# Patient Record
Sex: Female | Born: 1980 | Race: Black or African American | Hispanic: No | Marital: Single | Smoking: Never smoker
Health system: Southern US, Community
[De-identification: ages and names within clinical notes are randomized; demographics above are authoritative.]

## PROBLEM LIST (undated history)

## (undated) DIAGNOSIS — R61 Generalized hyperhidrosis: Secondary | ICD-10-CM

## (undated) DIAGNOSIS — R7689 Other specified abnormal immunological findings in serum: Secondary | ICD-10-CM

## (undated) DIAGNOSIS — R51 Headache: Secondary | ICD-10-CM

## (undated) DIAGNOSIS — E785 Hyperlipidemia, unspecified: Secondary | ICD-10-CM

## (undated) DIAGNOSIS — F32A Depression, unspecified: Secondary | ICD-10-CM

## (undated) DIAGNOSIS — F419 Anxiety disorder, unspecified: Secondary | ICD-10-CM

## (undated) DIAGNOSIS — R768 Other specified abnormal immunological findings in serum: Secondary | ICD-10-CM

## (undated) DIAGNOSIS — I10 Essential (primary) hypertension: Secondary | ICD-10-CM

## (undated) DIAGNOSIS — B019 Varicella without complication: Secondary | ICD-10-CM

## (undated) DIAGNOSIS — R519 Headache, unspecified: Secondary | ICD-10-CM

## (undated) HISTORY — DX: Headache, unspecified: R51.9

## (undated) HISTORY — DX: Generalized hyperhidrosis: R61

## (undated) HISTORY — DX: Headache: R51

## (undated) HISTORY — DX: Other specified abnormal immunological findings in serum: R76.8

## (undated) HISTORY — DX: Hyperlipidemia, unspecified: E78.5

## (undated) HISTORY — DX: Varicella without complication: B01.9

## (undated) HISTORY — DX: Other specified abnormal immunological findings in serum: R76.89

## (undated) HISTORY — PX: CYST EXCISION: SHX5701

## (undated) HISTORY — DX: Essential (primary) hypertension: I10

---

## 2001-11-22 ENCOUNTER — Other Ambulatory Visit: Admission: RE | Admit: 2001-11-22 | Discharge: 2001-11-22 | Payer: Self-pay | Admitting: Obstetrics and Gynecology

## 2002-04-10 ENCOUNTER — Inpatient Hospital Stay (HOSPITAL_COMMUNITY): Admission: AD | Admit: 2002-04-10 | Discharge: 2002-04-10 | Payer: Self-pay | Admitting: *Deleted

## 2002-04-10 ENCOUNTER — Encounter: Payer: Self-pay | Admitting: Obstetrics and Gynecology

## 2002-04-11 ENCOUNTER — Encounter (HOSPITAL_COMMUNITY): Admission: RE | Admit: 2002-04-11 | Discharge: 2002-04-29 | Payer: Self-pay | Admitting: Obstetrics and Gynecology

## 2002-04-22 ENCOUNTER — Inpatient Hospital Stay (HOSPITAL_COMMUNITY): Admission: AD | Admit: 2002-04-22 | Discharge: 2002-04-22 | Payer: Self-pay | Admitting: Obstetrics and Gynecology

## 2002-05-06 ENCOUNTER — Encounter (INDEPENDENT_AMBULATORY_CARE_PROVIDER_SITE_OTHER): Payer: Self-pay

## 2002-05-06 ENCOUNTER — Inpatient Hospital Stay (HOSPITAL_COMMUNITY): Admission: AD | Admit: 2002-05-06 | Discharge: 2002-05-09 | Payer: Self-pay | Admitting: Obstetrics and Gynecology

## 2002-05-10 ENCOUNTER — Observation Stay (HOSPITAL_COMMUNITY): Admission: AD | Admit: 2002-05-10 | Discharge: 2002-05-10 | Payer: Self-pay | Admitting: Obstetrics and Gynecology

## 2002-06-03 ENCOUNTER — Other Ambulatory Visit: Admission: RE | Admit: 2002-06-03 | Discharge: 2002-06-03 | Payer: Self-pay | Admitting: Obstetrics and Gynecology

## 2003-06-15 ENCOUNTER — Other Ambulatory Visit: Admission: RE | Admit: 2003-06-15 | Discharge: 2003-06-15 | Payer: Self-pay | Admitting: Obstetrics and Gynecology

## 2003-09-28 ENCOUNTER — Emergency Department (HOSPITAL_COMMUNITY): Admission: EM | Admit: 2003-09-28 | Discharge: 2003-09-28 | Payer: Self-pay | Admitting: Emergency Medicine

## 2003-11-30 ENCOUNTER — Encounter: Admission: RE | Admit: 2003-11-30 | Discharge: 2003-11-30 | Payer: Self-pay | Admitting: Family Medicine

## 2004-06-21 ENCOUNTER — Other Ambulatory Visit: Admission: RE | Admit: 2004-06-21 | Discharge: 2004-06-21 | Payer: Self-pay | Admitting: Obstetrics and Gynecology

## 2005-05-18 ENCOUNTER — Other Ambulatory Visit: Admission: RE | Admit: 2005-05-18 | Discharge: 2005-05-18 | Payer: Self-pay | Admitting: Obstetrics and Gynecology

## 2006-09-19 ENCOUNTER — Other Ambulatory Visit: Admission: RE | Admit: 2006-09-19 | Discharge: 2006-09-19 | Payer: Self-pay | Admitting: Obstetrics and Gynecology

## 2007-01-14 ENCOUNTER — Emergency Department (HOSPITAL_COMMUNITY): Admission: EM | Admit: 2007-01-14 | Discharge: 2007-01-15 | Payer: Self-pay | Admitting: Emergency Medicine

## 2007-01-16 ENCOUNTER — Ambulatory Visit: Payer: Self-pay | Admitting: Cardiovascular Disease

## 2007-02-05 ENCOUNTER — Ambulatory Visit: Payer: Self-pay

## 2007-02-05 ENCOUNTER — Encounter: Payer: Self-pay | Admitting: Cardiovascular Disease

## 2008-09-09 IMAGING — CR DG CHEST 2V
2 series · 2 of 2 positions shown · non-contrast
Comparison: none

HISTORY: Chest pain, vomiting

CHEST 2 VIEWS:
Comparison chest radiograph 09/28/2003
Normal heart size, mediastinal contours, and pulmonary vascularity.
Minimal bronchitic changes.
Lungs otherwise clear.
Mild scoliosis thoracolumbar spine.
No pleural effusion or pneumothorax.

[view not recorded (1 of 2)]
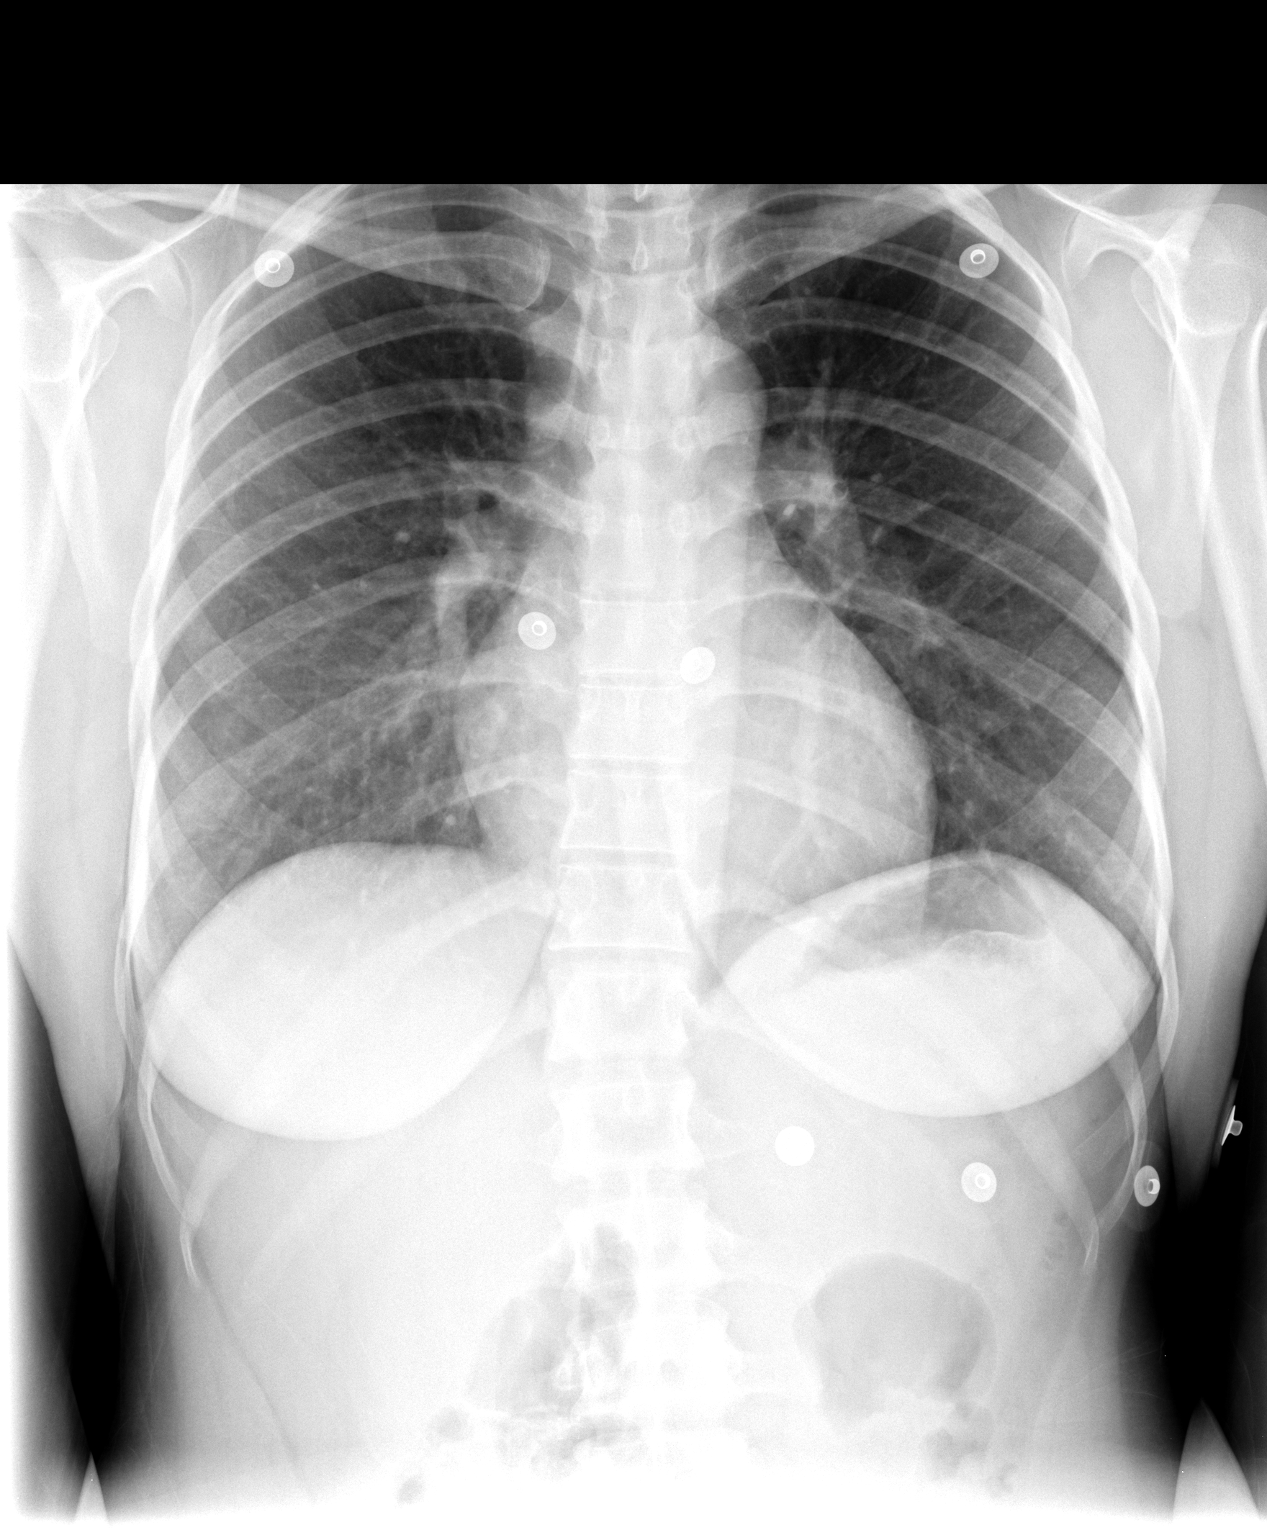

[view not recorded (2 of 2)]
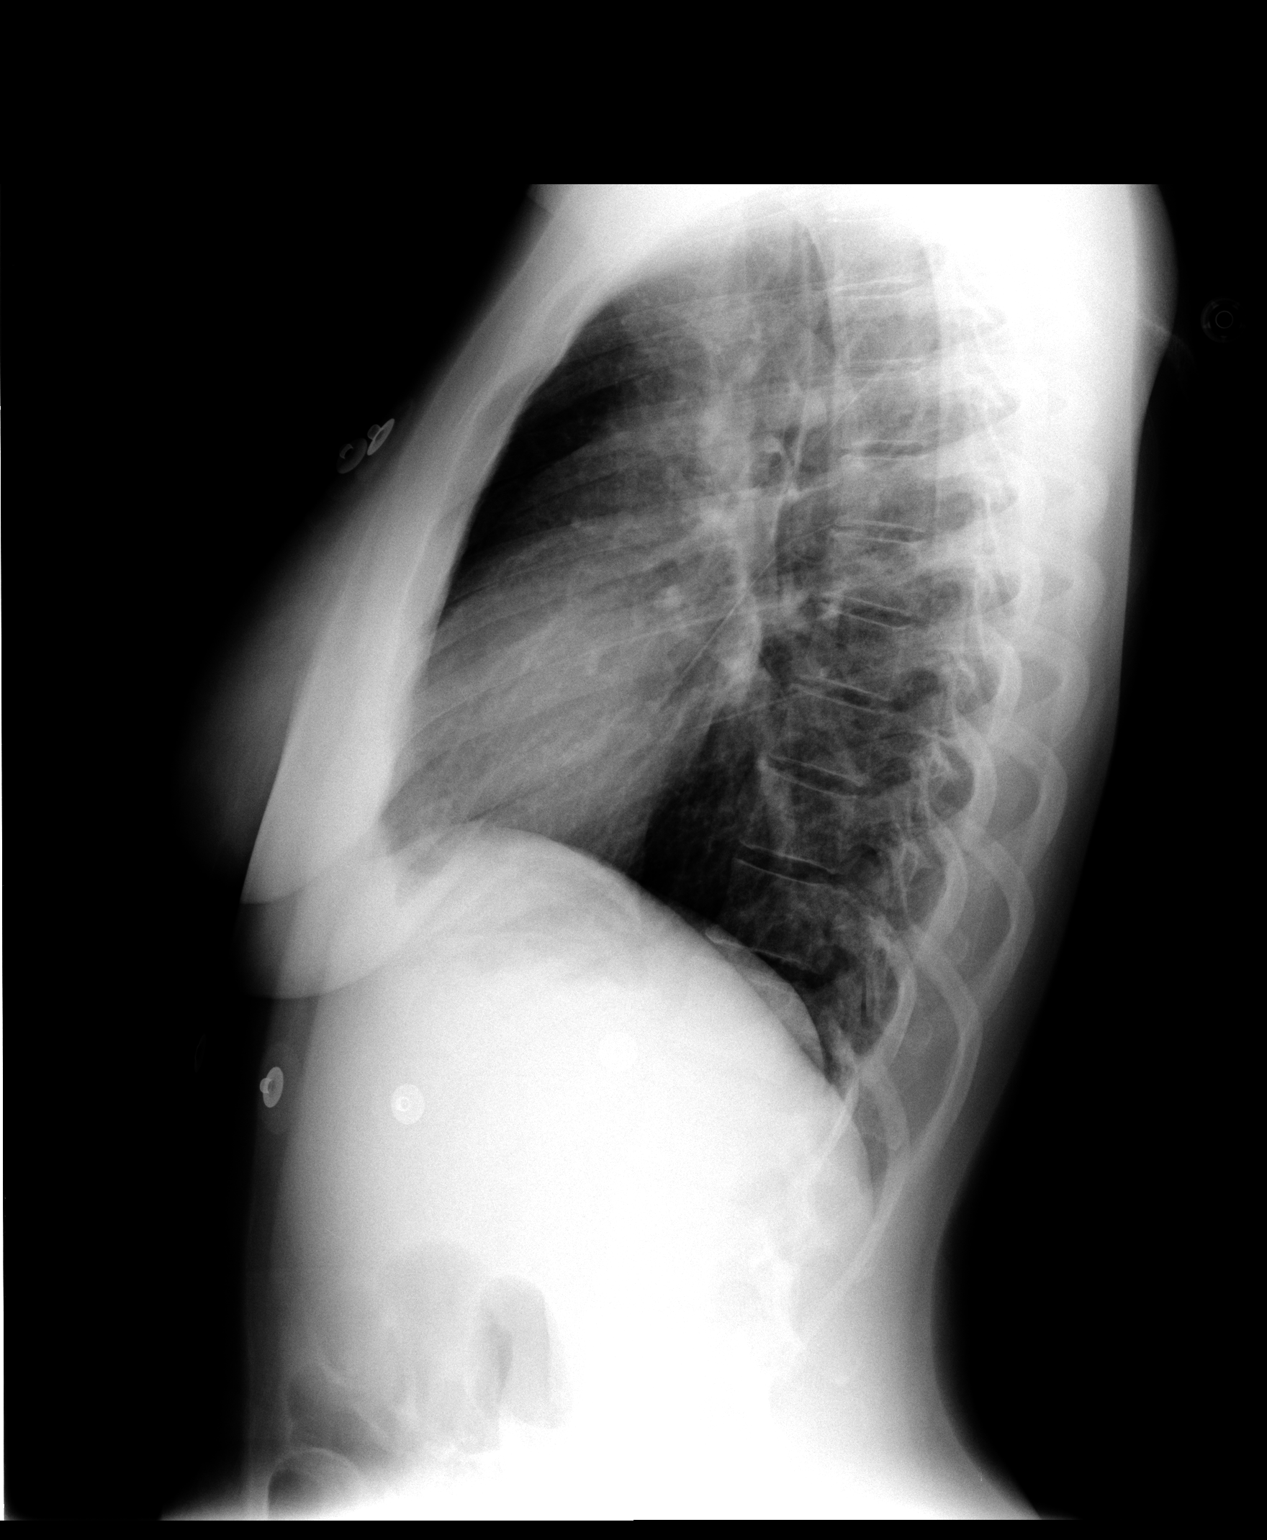

[2 of 2 positions shown; findings below may reference images not displayed]

IMPRESSION: Minimal bronchitic changes.
Mild scoliosis.

## 2010-11-29 NOTE — Assessment & Plan Note (Signed)
[facility]                            CARDIOLOGY OFFICE NOTE   NAME:Kristine [name], Kristine [name]                      MRN:          [id number]  DATE:01/16/2007                            DOB:          1980/09/10    Kristine [name] is seen today as a new consult.  She was recently seen in the  emergency room at [facility] on June 30 for chest pain.  The pain was  atypical.  She was driving her car.  She had some pain under her left  arm pit and upper shoulder, radiated towards her chest.  She had  subsequent nausea and an episode of vomiting.  There was no significant  diaphoresis, no syncope.  In the emergency room she ruled out for  myocardial infarction.  She had a baseline abnormal EKG that she knew  about from before.  Her lab work was fine.  She was treated with some  Zofran and antiinflammatories.  Chest x-rays showed minimal bronchitic  changes and scoliosis.  She was discharged and asked to follow up with a  cardiologist.  Since discharge she has been doing well.  She has not had  any recurrences.   Her coronary risk factors are benign.  She is not hypertensive,  nondiabetic, she is not a smoker, family history is negative.   The patient does work out at [employer] 2 to 3 times a week.  She does not  get lightheaded, there has been no syncope, palpitations or chest pain.   The patient's review of systems is otherwise negative.   She only takes birth control pills.   She has no known allergies.   FAMILY HISTORY:  Noncontributory.  She is divorced.  Her son [name] who  is 53 years old was with her.  She used to work here at [employer] 3-1/2  years ago and currently works at [employer].   She does not drink or smoke.   She has lots of family here in the area and lives out in [place].   She has seen Dr. Alwyn Ren in the past as a primary care doctor.   Her exam is remarkable for a healthy appearing young black female in no  distress.  Affect is  appropriate.  She is afebrile.  Blood pressure is 117/70.  Pulse is 86 and regular.  Respiratory rate is 12.  HEENT:  Normal.  NECK:  Supple.  There is no thyromegaly, no lymphadenopathy, no bruits  and no JVP elevation.  LUNGS:  Clear with good diaphragmatic motion.  No wheezing.  There is normal S1, S2 with normal heart sounds. PMI is normal.  ABDOMEN:  Benign.  Bowel sounds are positive. There is no  hepatosplenomegaly, no hepatojugular reflex, no organomegaly, no  tenderness, no bruits.  Femorals are +4 bilaterally without bruit.  PTs are +3.  There is no  edema.  NEURO:  Nonfocal.  SKIN:  Warm and dry. She does have a previous cyst removal on the left  hand with a keloid formation.  There is no muscular weakness.   EKG shows sinus rhythm with voltage  criteria for LVH in the limb leads  and nonspecific inferolateral ST-T wave changes.  I do not have an old  EKG to compare this to but she said that 1 of our techs,  had done one  on her 3 years ago.  We will try to locate it.   IMPRESSION:  1. Isolated episode of chest pain 3 days ago. No recurrence.  I      suspect that she had some musculoskeletal pain and then had a vagal      reaction to it.  She certainly seems fine now and her chest pain      has resolved.  2. Baseline abnormal EKG.  She needs a followup echocardiogram to rule      out any significant hypertrophic cardiomyopathy or LVH.  Apparently      there was one done here 3 years ago, but we have no record of it.  3. Given her abnormal EKG and chest pain, we will refer her for a      stress echo.  This will involve no radiation and should be      diagnostic of any problems.  She is actually a friend of Alona Bene      __________, our echo tech and we will try to have Alona Bene do the      study.  4. Birth control pills.  The patient is on birth control pills;      however, she is a nonsmoker and has not had previous deep vein      thrombosis.  I do not think there is any  contraindications to this      in regards to hypercoagulability.  5. As long as her stress echo is normal, I will see her on an as      needed basis.     Noralyn Pick. Eden Emms, MD, [facility]  Electronically Signed    PCN/MedQ  DD: 01/16/2007  DT: 01/17/2007  Job #: [id number]

## 2010-12-02 NOTE — Discharge Summary (Signed)
[name], [name]                           ACCOUNT NO.:  [id number]   MEDICAL RECORD NO.:  [id number]                   PATIENT TYPE:   LOCATION:                                       FACILITY:   PHYSICIAN:  James A. Ashley Royalty, M.D.             DATE OF BIRTH:   DATE OF ADMISSION:  DATE OF DISCHARGE:                                 DISCHARGE SUMMARY   DISCHARGE DIAGNOSES:  1. Intrauterine pregnancy at 40 weeks and 5 days gestation, delivered.  2. Term birth living child.  3. Arrest disorder of descent in labor, chorioamnionitis and fetal     tachycardia resulting in Cesarean section.   CONSULTATIONS:  None.   DISCHARGE MEDICATIONS:  Percocet.   HISTORY OF PRESENT ILLNESS:  This is a 30 year old [place]. [facility]  May 01, 2002 who presented to an office visit at which time she was  noted to be contracting every five to ten minutes. She was sent to [facility] for further evaluation.  For the remainder of the history and  physical, please see the chart.   HOSPITAL COURSE:  The patient was admitted to [facility]  [place]. Admission lab studies were drawn. Initial cervical examination  by me revealed a dilatation of 4 cm, 100% effacement, minus one station, and  vertex presentation. Artificial rupture of membranes was accomplished which  revealed clear fluid. Intrauterine pressure catheter was placed. The patient  subsequently developed a fever at 9+ cm gestation. At that point, she was  zero station. Further attempts were made to accomplish a vaginal delivery.  However, the patient was noted to have no descent in there hours despite  reasonable efforts to maximize the contraction quality and quantity. She  also developed fetal tachycardia. Hence, on May 06, 2002 for the above  reasons, she was taken to the OR and underwent primary low transverse C-  section. She was also noted to have meconium stain in the amniotic fluid.  The infant was an 8 pound  and 11 ounce female with Apgar's of 5 at one minute  and 9 at five minutes. Sent to the newborn nursery. Arterial cord pH was  7.26. The patient was maintained postpartum on IV antibiotics.   DISPOSITION:  On May 09, 2002 she was felt stable for discharged and  discharged home in satisfactory condition.   LABORATORY DATA:  Hemoglobin and hematocrit on admission were 12.6 and 36.4  respectively. Repeat value obtained on May 08, 2002 were 10.9 and 32.1  respectively. WBC count at the time of discharge was 18,200. RPR was non-  reactive.   FOLLOW UP:  The patient is to return to [facility] in four to six  weeks for postpartum evaluation.  James A. Ashley Royalty, M.D.    JAM/MEDQ  D:  06/15/2002  T:  06/15/2002  Job:  [id number]

## 2010-12-02 NOTE — Op Note (Signed)
NAME:  Kristine [name], Kristine [name]                         ACCOUNT NO.:  [id number]   MEDICAL RECORD NO.:  [id number]                   PATIENT TYPE:  INP   LOCATION:  9102                                 FACILITY:  [facility]   PHYSICIAN:  James A. Ashley Royalty, M.D.             DATE OF BIRTH:  [birth year]   DATE OF PROCEDURE:  05/06/2002  DATE OF DISCHARGE:                                 OPERATIVE REPORT   PREOPERATIVE DIAGNOSES:  1. Intrauterine pregnancy at term.  2. Arrest disorder of descent in labor.  3. Chorioamnionitis.  4. Fetal tachycardia.   POSTOPERATIVE DIAGNOSES:  1. Intrauterine pregnancy at term.  2. Meconium stained amniotic fluid.   OPERATION PERFORMED:  Primary low transverse cesarean section.   SURGEON:  Rudy Jew. Ashley Royalty, M.D.   ANESTHESIA:  Epidural.   FINDINGS:  An 8 pound, 11 ounce female, Apgars 5 at one minute, 9 at five  minutes, sent to newborn nursery.  Pediatrics team present at delivery.  Arterial cord pH 7.26.   ESTIMATED BLOOD LOSS:  600 cc.   COMPLICATIONS:  None.   PACKS AND DRAINS:  Foley catheter.   DESCRIPTION OF PROCEDURE:  The patient was taken to the operating room and  placed in dorsal supine position.  Epidural anesthetic was administered to a  surgical level.  She was prepped and draped in the usual manner for  abdominal surgery.  The Foley catheter had been previously placed.  A  Pfannenstiel incision was made down to the level of the fascia which was  nicked with a knife and incised transversely with Mayo scissors.  The  underlying rectus muscles were separated from the fascia using sharp and  blunt dissection.  The rectus muscles were separated in the midline exposing  the peritoneum which was then elevated and entered atraumatically with  Metzenbaum scissors.  The incision was extended longitudinally.  The uterus  was identified and a bladder flap created by incising the anterior uterine  serosa and sharply and bluntly dissected from the  bladder inferiorly.  It  was held in place with a bladder blade.  The uterine wall was noted to be  somewhat edematous.  The uterus was then entered through a low transverse  incision using sharp and blunt dissection.  Upon entry into the amniotic  cavity, the fluid was noted to be meconium stained for the first time.  The  infant was then delivered from the vertex presentation.  At delivery of the  head, DeLee suction was employed.  Full delivery was then accomplished and  the infant given immediately to the waiting pediatrics team after triply  clamping and cutting the cord.  Arterial cord pH was obtained from an  isolated segment.  Next, regular cord blood was obtained.  The placenta and  membranes were removed in their entirety and submitted to pathology for  histologic studies.  The uterus was exteriorized.  The uterus was  then  closed with two layers of running # 1 Vicryl, the first in a running locking  layer, the second was a running, intermittently locking, and imbricating  layer.  One additional figure-of-eight suture was required on the left side  to obtain hemostasis.  Hemostasis was noted.  The uterus, tubes and ovaries  were inspected and found to be otherwise normal.  They were returned to the  abdominal cavity.  Copious irrigation was accomplished.  Hemostasis was  noted.  The fascia was then closed with 0 Vicryl in a running fashion.  The  skin was closed with staples.  The patient tolerated the procedure extremely  well and was returned to the recovery room in good condition.                                                James A. Ashley Royalty, M.D.    JAM/MEDQ  D:  05/06/2002  T:  05/07/2002  Job:  [id number]

## 2011-05-03 LAB — DIFFERENTIAL
Basophils Relative: 1
Lymphocytes Relative: 27
Lymphs Abs: 2.5
Monocytes Relative: 9
Neutro Abs: 5.8
Neutrophils Relative %: 62

## 2011-05-03 LAB — CBC
RBC: 4.45
WBC: 9.3

## 2011-08-21 ENCOUNTER — Other Ambulatory Visit: Payer: Self-pay | Admitting: Obstetrics and Gynecology

## 2011-08-21 ENCOUNTER — Other Ambulatory Visit (HOSPITAL_COMMUNITY)
Admission: RE | Admit: 2011-08-21 | Discharge: 2011-08-21 | Disposition: A | Discharge status: Home / Self Care | Payer: PRIVATE HEALTH INSURANCE | Source: Ambulatory Visit | Attending: Obstetrics and Gynecology | Admitting: Obstetrics and Gynecology

## 2011-08-21 DIAGNOSIS — Z01419 Encounter for gynecological examination (general) (routine) without abnormal findings: Secondary | ICD-10-CM | POA: Insufficient documentation

## 2013-07-21 ENCOUNTER — Encounter: Payer: Self-pay | Admitting: Nurse Practitioner

## 2013-07-21 ENCOUNTER — Ambulatory Visit (INDEPENDENT_AMBULATORY_CARE_PROVIDER_SITE_OTHER): Payer: No Typology Code available for payment source | Admitting: Nurse Practitioner

## 2013-07-21 VITALS — BP 112/70 | HR 80 | Temp 98.2°F | Ht 63.16 in | Wt 147.5 lb

## 2013-07-21 DIAGNOSIS — Z0000 Encounter for general adult medical examination without abnormal findings: Secondary | ICD-10-CM

## 2013-07-21 DIAGNOSIS — R030 Elevated blood-pressure reading, without diagnosis of hypertension: Secondary | ICD-10-CM

## 2013-07-21 DIAGNOSIS — IMO0001 Reserved for inherently not codable concepts without codable children: Secondary | ICD-10-CM

## 2013-07-21 DIAGNOSIS — Z23 Encounter for immunization: Secondary | ICD-10-CM

## 2013-07-21 DIAGNOSIS — I10 Essential (primary) hypertension: Secondary | ICD-10-CM | POA: Insufficient documentation

## 2013-07-21 LAB — CBC
HCT: 37.6 % (ref 36.0–46.0)
Hemoglobin: 12.4 g/dL (ref 12.0–15.0)
MCHC: 33.1 g/dL (ref 30.0–36.0)
MCV: 80.2 fl (ref 78.0–100.0)
PLATELETS: 336 10*3/uL (ref 150.0–400.0)
RBC: 4.69 Mil/uL (ref 3.87–5.11)
RDW: 13.6 % (ref 11.5–14.6)
WBC: 6.2 10*3/uL (ref 4.5–10.5)

## 2013-07-21 LAB — COMPREHENSIVE METABOLIC PANEL
ALBUMIN: 4.6 g/dL (ref 3.5–5.2)
ALK PHOS: 64 U/L (ref 39–117)
ALT: 27 U/L (ref 0–35)
AST: 22 U/L (ref 0–37)
BUN: 12 mg/dL (ref 6–23)
CALCIUM: 9.6 mg/dL (ref 8.4–10.5)
CHLORIDE: 103 meq/L (ref 96–112)
CO2: 25 mEq/L (ref 19–32)
CREATININE: 0.7 mg/dL (ref 0.4–1.2)
GFR: 118.93 mL/min (ref >60.00)
Glucose, Bld: 86 mg/dL (ref 70–99)
POTASSIUM: 4.4 meq/L (ref 3.5–5.1)
Sodium: 135 mEq/L (ref 135–145)
Total Bilirubin: 0.6 mg/dL (ref 0.3–1.2)
Total Protein: 8.3 g/dL (ref 6.0–8.3)

## 2013-07-21 LAB — URINALYSIS, ROUTINE W REFLEX MICROSCOPIC
BILIRUBIN URINE: NEGATIVE
KETONES UR: NEGATIVE
Leukocytes, UA: NEGATIVE
Nitrite: NEGATIVE
SPECIFIC GRAVITY, URINE: 1.025 (ref 1.000–1.030)
Total Protein, Urine: NEGATIVE
UROBILINOGEN UA: 0.2 (ref 0.0–1.0)
Urine Glucose: NEGATIVE
pH: 6 (ref 5.0–8.0)

## 2013-07-21 LAB — LIPID PANEL
CHOL/HDL RATIO: 5
Cholesterol: 256 mg/dL — ABNORMAL HIGH (ref 0–200)
HDL: 55.9 mg/dL (ref >39.00)
TRIGLYCERIDES: 73 mg/dL (ref 0.0–149.0)
VLDL: 14.6 mg/dL (ref 0.0–40.0)

## 2013-07-21 LAB — T4, FREE: Free T4: 0.72 ng/dL (ref 0.60–1.60)

## 2013-07-21 LAB — TSH: TSH: 0.78 u[IU]/mL (ref 0.35–5.50)

## 2013-07-21 MED ORDER — TETANUS-DIPHTH-ACELL PERTUSSIS 5-2.5-18.5 LF-MCG/0.5 IM SUSP: 0.5000 mL | Freq: Once | INTRAMUSCULAR | Status: DC

## 2013-07-21 NOTE — Patient Instructions (Addendum)
Our office will call with results. Take BP at work several times on different days, call our office w/readings.  Use tylenol for pain rather than ibuprophen or aleve, as they can make BP go up. Practice deep breathing. Pay attention to body cues that stress is increasing.  Use daily sinus rinse for nasal congestion (Neilmed sinus rinse). Use benzocaine throat lozenge for sore throat. Increase fiber by eating something fresh every day-pears are high in fiber.   Preventive Care for Adults, Female A healthy lifestyle and preventive care can promote health and wellness. Preventive health guidelines for women include the following key practices.  A routine yearly physical is a good way to check with your caregiver about your health and preventive screening. It is a chance to share any concerns and updates on your health, and to receive a thorough exam.  Visit your dentist for a routine exam and preventive care every 6 months. Brush your teeth twice a day and floss once a day. Good oral hygiene prevents tooth decay and gum disease.  The frequency of eye exams is based on your age, health, family medical history, use of contact lenses, and other factors. Follow your caregiver's recommendations for frequency of eye exams.  Eat a healthy diet. Foods like vegetables, fruits, whole grains, low-fat dairy products, and lean protein foods contain the nutrients you need without too many calories. Decrease your intake of foods high in solid fats, added sugars, and salt. Eat the right amount of calories for you.Get information about a proper diet from your caregiver, if necessary.  Regular physical exercise is one of the most important things you can do for your health. Most adults should get at least 150 minutes of moderate-intensity exercise (any activity that increases your heart rate and causes you to sweat) each week. In addition, most adults need muscle-strengthening exercises on 2 or more days a  week.  Maintain a healthy weight. The body mass index (BMI) is a screening tool to identify possible weight problems. It provides an estimate of body fat based on height and weight. Your caregiver can help determine your BMI, and can help you achieve or maintain a healthy weight.For adults 20 years and older:  A BMI below 18.5 is considered underweight.  A BMI of 18.5 to 24.9 is normal.  A BMI of 25 to 29.9 is considered overweight.  A BMI of 30 and above is considered obese.  Maintain normal blood lipids and cholesterol levels by exercising and minimizing your intake of saturated fat. Eat a balanced diet with plenty of fruit and vegetables. Blood tests for lipids and cholesterol should begin at age 72 and be repeated every 5 years. If your lipid or cholesterol levels are high, you are over 50, or you are at high risk for heart disease, you may need your cholesterol levels checked more frequently.Ongoing high lipid and cholesterol levels should be treated with medicines if diet and exercise are not effective.  If you smoke, find out from your caregiver how to quit. If you do not use tobacco, do not start.  Lung cancer screening is recommended for adults aged 48 80 years who are at high risk for developing lung cancer because of a history of smoking. Yearly low-dose computed tomography (CT) is recommended for people who have at least a 30-pack-year history of smoking and are a current smoker or have quit within the past 15 years. A pack year of smoking is smoking an average of 1 pack of cigarettes a  day for 1 year (for example: 1 pack a day for 30 years or 2 packs a day for 15 years). Yearly screening should continue until the smoker has stopped smoking for at least 15 years. Yearly screening should also be stopped for people who develop a health problem that would prevent them from having lung cancer treatment.  If you are pregnant, do not drink alcohol. If you are breastfeeding, be very cautious  about drinking alcohol. If you are not pregnant and choose to drink alcohol, do not exceed 1 drink per day. One drink is considered to be 12 ounces (355 mL) of beer, 5 ounces (148 mL) of wine, or 1.5 ounces (44 mL) of liquor.  Avoid use of street drugs. Do not share needles with anyone. Ask for help if you need support or instructions about stopping the use of drugs.  High blood pressure causes heart disease and increases the risk of stroke. Your blood pressure should be checked at least every 1 to 2 years. Ongoing high blood pressure should be treated with medicines if weight loss and exercise are not effective.  If you are 70 to 32 years old, ask your caregiver if you should take aspirin to prevent strokes.  Diabetes screening involves taking a blood sample to check your fasting blood sugar level. This should be done once every 3 years, after age 58, if you are within normal weight and without risk factors for diabetes. Testing should be considered at a younger age or be carried out more frequently if you are overweight and have at least 1 risk factor for diabetes.  Breast cancer screening is essential preventive care for women. You should practice "breast self-awareness." This means understanding the normal appearance and feel of your breasts and may include breast self-examination. Any changes detected, no matter how small, should be reported to a caregiver. Women in their 71s and 30s should have a clinical breast exam (CBE) by a caregiver as part of a regular health exam every 1 to 3 years. After age 29, women should have a CBE every year. Starting at age 49, women should consider having a mammography (breast X-ray test) every year. Women who have a family history of breast cancer should talk to their caregiver about genetic screening. Women at a high risk of breast cancer should talk to their caregivers about having magnetic resonance imaging (MRI) and a mammography every year.  Breast cancer gene  (BRCA)-related cancer risk assessment is recommended for women who have family members with BRCA-related cancers. BRCA-related cancers include breast, ovarian, tubal, and peritoneal cancers. Having family members with these cancers may be associated with an increased risk for harmful changes (mutations) in the breast cancer genes BRCA1 and BRCA2. Results of the assessment will determine the need for genetic counseling and BRCA1 and BRCA2 testing.  The Pap test is a screening test for cervical cancer. A Pap test can show cell changes on the cervix that might become cervical cancer if left untreated. A Pap test is a procedure in which cells are obtained and examined from the lower end of the uterus (cervix).  Women should have a Pap test starting at age 54.  Between ages 81 and 4, Pap tests should be repeated every 2 years.  Beginning at age 33, you should have a Pap test every 3 years as long as the past 3 Pap tests have been normal.  Some women have medical problems that increase the chance of getting cervical cancer. Talk to your  caregiver about these problems. It is especially important to talk to your caregiver if a new problem develops soon after your last Pap test. In these cases, your caregiver may recommend more frequent screening and Pap tests.  The above recommendations are the same for women who have or have not gotten the vaccine for human papillomavirus (HPV).  If you had a hysterectomy for a problem that was not cancer or a condition that could lead to cancer, then you no longer need Pap tests. Even if you no longer need a Pap test, a regular exam is a good idea to make sure no other problems are starting.  If you are between ages 108 and 35, and you have had normal Pap tests going back 10 years, you no longer need Pap tests. Even if you no longer need a Pap test, a regular exam is a good idea to make sure no other problems are starting.  If you have had past treatment for cervical  cancer or a condition that could lead to cancer, you need Pap tests and screening for cancer for at least 20 years after your treatment.  If Pap tests have been discontinued, risk factors (such as a new sexual partner) need to be reassessed to determine if screening should be resumed.  The HPV test is an additional test that may be used for cervical cancer screening. The HPV test looks for the virus that can cause the cell changes on the cervix. The cells collected during the Pap test can be tested for HPV. The HPV test could be used to screen women aged 4 years and older, and should be used in women of any age who have unclear Pap test results. After the age of 67, women should have HPV testing at the same frequency as a Pap test.  Colorectal cancer can be detected and often prevented. Most routine colorectal cancer screening begins at the age of 47 and continues through age 65. However, your caregiver may recommend screening at an earlier age if you have risk factors for colon cancer. On a yearly basis, your caregiver may provide home test kits to check for hidden blood in the stool. Use of a small camera at the end of a tube, to directly examine the colon (sigmoidoscopy or colonoscopy), can detect the earliest forms of colorectal cancer. Talk to your caregiver about this at age 68, when routine screening begins. Direct examination of the colon should be repeated every 5 to 10 years through age 32, unless early forms of pre-cancerous polyps or small growths are found.  Hepatitis C blood testing is recommended for all people born from [birth year] through 1964 and any individual with known risks for hepatitis C.  Practice safe sex. Use condoms and avoid high-risk sexual practices to reduce the spread of sexually transmitted infections (STIs). STIs include gonorrhea, chlamydia, syphilis, trichomonas, herpes, HPV, and human immunodeficiency virus (HIV). Herpes, HIV, and HPV are viral illnesses that have no cure.  They can result in disability, cancer, and death. Sexually active women aged 48 and younger should be checked for chlamydia. Older women with new or multiple partners should also be tested for chlamydia. Testing for other STIs is recommended if you are sexually active and at increased risk.  Osteoporosis is a disease in which the bones lose minerals and strength with aging. This can result in serious bone fractures. The risk of osteoporosis can be identified using a bone density scan. Women ages 75 and over and women at  risk for fractures or osteoporosis should discuss screening with their caregivers. Ask your caregiver whether you should take a calcium supplement or vitamin D to reduce the rate of osteoporosis.  Menopause can be associated with physical symptoms and risks. Hormone replacement therapy is available to decrease symptoms and risks. You should talk to your caregiver about whether hormone replacement therapy is right for you.  Use sunscreen. Apply sunscreen liberally and repeatedly throughout the day. You should seek shade when your shadow is shorter than you. Protect yourself by wearing long sleeves, pants, a wide-brimmed hat, and sunglasses year round, whenever you are outdoors.  Once a month, do a whole body skin exam, using a mirror to look at the skin on your back. Notify your caregiver of new moles, moles that have irregular borders, moles that are larger than a pencil eraser, or moles that have changed in shape or color.  Stay current with required immunizations.  Influenza vaccine. All adults should be immunized every year.  Tetanus, diphtheria, and acellular pertussis (Td, Tdap) vaccine. Pregnant women should receive 1 dose of Tdap vaccine during each pregnancy. The dose should be obtained regardless of the length of time since the last dose. Immunization is preferred during the 27th to 36th week of gestation. An adult who has not previously received Tdap or who does not know her  vaccine status should receive 1 dose of Tdap. This initial dose should be followed by tetanus and diphtheria toxoids (Td) booster doses every 10 years. Adults with an unknown or incomplete history of completing a 3-dose immunization series with Td-containing vaccines should begin or complete a primary immunization series including a Tdap dose. Adults should receive a Td booster every 10 years.  Varicella vaccine. An adult without evidence of immunity to varicella should receive 2 doses or a second dose if she has previously received 1 dose. Pregnant females who do not have evidence of immunity should receive the first dose after pregnancy. This first dose should be obtained before leaving the health care facility. The second dose should be obtained 4 8 weeks after the first dose.  Human papillomavirus (HPV) vaccine. Females aged 42 26 years who have not received the vaccine previously should obtain the 3-dose series. The vaccine is not recommended for use in pregnant females. However, pregnancy testing is not needed before receiving a dose. If a female is found to be pregnant after receiving a dose, no treatment is needed. In that case, the remaining doses should be delayed until after the pregnancy. Immunization is recommended for any person with an immunocompromised condition through the age of 34 years if she did not get any or all doses earlier. During the 3-dose series, the second dose should be obtained 4 8 weeks after the first dose. The third dose should be obtained 24 weeks after the first dose and 16 weeks after the second dose.  Zoster vaccine. One dose is recommended for adults aged 35 years or older unless certain conditions are present.  Measles, mumps, and rubella (MMR) vaccine. Adults born before [birth year] generally are considered immune to measles and mumps. Adults born in [birth year] or later should have 1 or more doses of MMR vaccine unless there is a contraindication to the vaccine or there is  laboratory evidence of immunity to each of the three diseases. A routine second dose of MMR vaccine should be obtained at least 28 days after the first dose for students attending postsecondary schools, health care workers, or international travelers. People who  received inactivated measles vaccine or an unknown type of measles vaccine during 1963 1967 should receive 2 doses of MMR vaccine. People who received inactivated mumps vaccine or an unknown type of mumps vaccine before 1979 and are at high risk for mumps infection should consider immunization with 2 doses of MMR vaccine. For females of childbearing age, rubella immunity should be determined. If there is no evidence of immunity, females who are not pregnant should be vaccinated. If there is no evidence of immunity, females who are pregnant should delay immunization until after pregnancy. Unvaccinated health care workers born before [birth year] who lack laboratory evidence of measles, mumps, or rubella immunity or laboratory confirmation of disease should consider measles and mumps immunization with 2 doses of MMR vaccine or rubella immunization with 1 dose of MMR vaccine.  Pneumococcal 13-valent conjugate (PCV13) vaccine. When indicated, a person who is uncertain of her immunization history and has no record of immunization should receive the PCV13 vaccine. An adult aged 26 years or older who has certain medical conditions and has not been previously immunized should receive 1 dose of PCV13 vaccine. This PCV13 should be followed with a dose of pneumococcal polysaccharide (PPSV23) vaccine. The PPSV23 vaccine dose should be obtained at least 8 weeks after the dose of PCV13 vaccine. An adult aged 84 years or older who has certain medical conditions and previously received 1 or more doses of PPSV23 vaccine should receive 1 dose of PCV13. The PCV13 vaccine dose should be obtained 1 or more years after the last PPSV23 vaccine dose.  Pneumococcal polysaccharide  (PPSV23) vaccine. When PCV13 is also indicated, PCV13 should be obtained first. All adults aged 78 years and older should be immunized. An adult younger than age 83 years who has certain medical conditions should be immunized. Any person who resides in a nursing home or long-term care facility should be immunized. An adult smoker should be immunized. People with an immunocompromised condition and certain other conditions should receive both PCV13 and PPSV23 vaccines. People with human immunodeficiency virus (HIV) infection should be immunized as soon as possible after diagnosis. Immunization during chemotherapy or radiation therapy should be avoided. Routine use of PPSV23 vaccine is not recommended for American Indians, [place] Natives, or people younger than 65 years unless there are medical conditions that require PPSV23 vaccine. When indicated, people who have unknown immunization and have no record of immunization should receive PPSV23 vaccine. One-time revaccination 5 years after the first dose of PPSV23 is recommended for people aged 80 64 years who have chronic kidney failure, nephrotic syndrome, asplenia, or immunocompromised conditions. People who received 1 2 doses of PPSV23 before age 67 years should receive another dose of PPSV23 vaccine at age 55 years or later if at least 5 years have passed since the previous dose. Doses of PPSV23 are not needed for people immunized with PPSV23 at or after age 52 years.  Meningococcal vaccine. Adults with asplenia or persistent complement component deficiencies should receive 2 doses of quadrivalent meningococcal conjugate (MenACWY-D) vaccine. The doses should be obtained at least 2 months apart. Microbiologists working with certain meningococcal bacteria, [place] recruits, people at risk during an outbreak, and people who travel to or live in countries with a high rate of meningitis should be immunized. A first-year college student up through age 44 years who is  living in a residence hall should receive a dose if she did not receive a dose on or after her 16th birthday. Adults who have certain high-risk conditions should  receive one or more doses of vaccine.  Hepatitis A vaccine. Adults who wish to be protected from this disease, have certain high-risk conditions, work with hepatitis A-infected animals, work in hepatitis A research labs, or travel to or work in countries with a high rate of hepatitis A should be immunized. Adults who were previously unvaccinated and who anticipate close contact with an international adoptee during the first 60 days after arrival in the [place] from a country with a high rate of hepatitis A should be immunized.  Hepatitis B vaccine. Adults who wish to be protected from this disease, have certain high-risk conditions, may be exposed to blood or other infectious body fluids, are household contacts or sex partners of hepatitis B positive people, are clients or workers in certain care facilities, or travel to or work in countries with a high rate of hepatitis B should be immunized.  Haemophilus influenzae type b (Hib) vaccine. A previously unvaccinated person with asplenia or sickle cell disease or having a scheduled splenectomy should receive 1 dose of Hib vaccine. Regardless of previous immunization, a recipient of a hematopoietic stem cell transplant should receive a 3-dose series 6 12 months after her successful transplant. Hib vaccine is not recommended for adults with HIV infection. Preventive Services / Frequency Ages 77 to 66  Blood pressure check.** / Every 1 to 2 years.  Lipid and cholesterol check.** / Every 5 years beginning at age 23.  Clinical breast exam.** / Every 3 years for women in their 37s and 54s.  BRCA-related cancer risk assessment.** / For women who have family members with a BRCA-related cancer (breast, ovarian, tubal, or peritoneal cancers).  Pap test.** / Every 2 years from ages 56 through 76.  Every 3 years starting at age 2 through age 32 or 24 with a history of 3 consecutive normal Pap tests.  HPV screening.** / Every 3 years from ages 59 through ages 64 to 35 with a history of 3 consecutive normal Pap tests.  Hepatitis C blood test.** / For any individual with known risks for hepatitis C.  Skin self-exam. / Monthly.  Influenza vaccine. / Every year.  Tetanus, diphtheria, and acellular pertussis (Tdap, Td) vaccine.** / Consult your caregiver. Pregnant women should receive 1 dose of Tdap vaccine during each pregnancy. 1 dose of Td every 10 years.  Varicella vaccine.** / Consult your caregiver. Pregnant females who do not have evidence of immunity should receive the first dose after pregnancy.  HPV vaccine. / 3 doses over 6 months, if 54 and younger. The vaccine is not recommended for use in pregnant females. However, pregnancy testing is not needed before receiving a dose.  Measles, mumps, rubella (MMR) vaccine.** / You need at least 1 dose of MMR if you were born in 1957 or later. You may also need a 2nd dose. For females of childbearing age, rubella immunity should be determined. If there is no evidence of immunity, females who are not pregnant should be vaccinated. If there is no evidence of immunity, females who are pregnant should delay immunization until after pregnancy.  Pneumococcal 13-valent conjugate (PCV13) vaccine.** / Consult your caregiver.  Pneumococcal polysaccharide (PPSV23) vaccine.** / 1 to 2 doses if you smoke cigarettes or if you have certain conditions.  Meningococcal vaccine.** / 1 dose if you are age 52 to 32 years and a Market researcher living in a residence hall, or have one of several medical conditions, you need to get vaccinated against meningococcal disease. You may also  need additional booster doses.  Hepatitis A vaccine.** / Consult your caregiver.  Hepatitis B vaccine.** / Consult your caregiver.  Haemophilus influenzae type b (Hib)  vaccine.** / Consult your caregiver. ** Family history and personal history of risk and conditions may change your caregiver's recommendations. Document Released: 08/29/2001 Document Revised: 10/28/2012 Document Reviewed: 11/28/2010 [facility] Patient Information 2014 [place], [place].

## 2013-07-21 NOTE — Progress Notes (Signed)
Pre-visit discussion using our clinic review tool. No additional management support is needed unless otherwise documented below in the visit note.  

## 2013-07-21 NOTE — Progress Notes (Signed)
Subjective:     Kristine [name] is a 33 y.o. female and is here for a comprehensive physical exam. The patient reports problems - elevated blood pressure reasdings accompanied by headache while at work last week. Readings ranged from 147/107- 136/96. Home reading was 122/80.Marland Kitchen  History   Social History  . Marital Status: Single    Spouse Name: N/A    Number of Children: 1  . Years of Education: College   Occupational History  . [employer]   Social History Main Topics  . Smoking status: Never Smoker   . Smokeless tobacco: Never Used  . Alcohol Use: Yes     Comment: socially  . Drug Use: No  . Sexual Activity: No   Other Topics Concern  . Not on file   Social History Narrative   Kristine [name] lives with her son in [place]. 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   Health Maintenance  Topic Date Due  . Tetanus/tdap  09/12/2000  . Influenza Vaccine  02/14/2013  . Pap Smear  08/20/2014    The following portions of the patient's history were reviewed and updated as appropriate: allergies, current medications, past family history, past medical history, past social history, past surgical history and problem list.  Review of Systems Constitutional: negative for chills, fatigue and night sweats Eyes: positive for contacts/glasses Ears, nose, mouth, throat, and face: negative Respiratory: positive for cough Cardiovascular: negative for chest pain, chest pressure/discomfort, irregular heart beat and lower extremity edema Gastrointestinal: negative Genitourinary:negative Integument/breast: negative Musculoskeletal:negative Neurological: positive for headaches and r/t BP Behavioral/Psych: negative for excessive alcohol consumption, tobacco use and positive for sleep disturbance & stress at work   Objective:    BP 112/70  Pulse 80  Temp(Src) 98.2 F (36.8 C) (Oral)  Ht 5' 3.16" (1.604 m)  Wt 147 lb 8 oz (66.906 kg)  BMI 26.00  kg/m2  SpO2 99%  LMP 08/03/2012 General appearance: alert, cooperative, appears stated age and no distress Head: Normocephalic, without obvious abnormality, atraumatic Eyes: negative findings: lids and lashes normal, conjunctivae and sclerae normal, corneas clear and pupils equal, round, reactive to light and accomodation Ears: normal TM's and external ear canals both ears Throat: lips, mucosa, and tongue normal; teeth and gums normal Neck: no adenopathy, no carotid bruit, supple, symmetrical, trachea midline and thyroid not enlarged, symmetric, no tenderness/mass/nodules Lungs: clear to auscultation bilaterally Heart: regular rate and rhythm, S1, S2 normal, no murmur, click, rub or gallop Abdomen: soft, non-tender; bowel sounds normal; no masses,  no organomegaly Extremities: extremities normal, atraumatic, no cyanosis or edema Pulses: 2+ and symmetric Skin: Skin color, texture, turgor normal. No rashes or lesions Lymph nodes: Cervical, supraclavicular, and axillary nodes normal. Neurologic: Alert and oriented X 3, normal strength and tone. Normal symmetric reflexes. Normal coordination and gait    Assessment:    1 preventive care: needs Pap in 2016, screening labs, needs tdap, declined flu 2 reports elevated blood pressure at work, normal reading here    Plan:    1 admin Tdap today, will monitor for lab results. 2 Instructed on how to take BP readings, pt will take several readings at work & call with results will treat as appropriate See After Visit Summary for Counseling Recommendations

## 2013-07-22 ENCOUNTER — Telehealth: Payer: Self-pay | Admitting: Nurse Practitioner

## 2013-07-22 LAB — LDL CHOLESTEROL, DIRECT: Direct LDL: 188.2 mg/dL

## 2013-07-22 LAB — VITAMIN D 25 HYDROXY (VIT D DEFICIENCY, FRACTURES): Vit D, 25-Hydroxy: 13 ng/mL — ABNORMAL LOW (ref 30–89)

## 2013-07-22 NOTE — Telephone Encounter (Signed)
Vit D too low-will start Vit D supplement 5000 iu daily Hgb in urine-want to check again, if still pos will refer for urology work up All other labs nml

## 2013-07-23 ENCOUNTER — Other Ambulatory Visit: Payer: Self-pay | Admitting: *Deleted

## 2013-07-23 ENCOUNTER — Ambulatory Visit: Payer: PRIVATE HEALTH INSURANCE | Admitting: Nurse Practitioner

## 2013-07-23 NOTE — Telephone Encounter (Signed)
Patient notified of results. Patient stated that she was not having menses when she gave sample. Patient will go to [facility] lab to give urine sample a few days before appt in February.

## 2013-07-23 NOTE — Telephone Encounter (Signed)
LMOVM for patient to return call concerning results.  

## 2013-07-23 NOTE — Telephone Encounter (Signed)
I do not need to see pt for 6 mos. She just needs lab appt. For urine in 1 mo. Please adjust & notify pt.

## 2013-07-23 NOTE — Telephone Encounter (Signed)
Patient notified and appt canceled.

## 2013-08-19 ENCOUNTER — Other Ambulatory Visit: Payer: No Typology Code available for payment source

## 2013-08-19 ENCOUNTER — Other Ambulatory Visit: Payer: Self-pay | Admitting: *Deleted

## 2013-08-19 DIAGNOSIS — N390 Urinary tract infection, site not specified: Secondary | ICD-10-CM

## 2013-08-20 LAB — URINE CULTURE
Colony Count: NO GROWTH
Organism ID, Bacteria: NO GROWTH

## 2013-08-21 ENCOUNTER — Telehealth: Payer: Self-pay | Admitting: Nurse Practitioner

## 2013-08-22 ENCOUNTER — Ambulatory Visit: Payer: No Typology Code available for payment source | Admitting: Nurse Practitioner

## 2013-08-22 NOTE — Telephone Encounter (Signed)
Patient will come by office 2/9/215 first thing in the morning and give another urine sample.

## 2013-08-22 NOTE — Telephone Encounter (Signed)
LMOVM for pt to return call 

## 2013-08-25 ENCOUNTER — Other Ambulatory Visit: Payer: Self-pay | Admitting: *Deleted

## 2013-08-25 ENCOUNTER — Other Ambulatory Visit (INDEPENDENT_AMBULATORY_CARE_PROVIDER_SITE_OTHER): Payer: No Typology Code available for payment source

## 2013-08-25 DIAGNOSIS — R030 Elevated blood-pressure reading, without diagnosis of hypertension: Secondary | ICD-10-CM

## 2013-08-25 DIAGNOSIS — IMO0001 Reserved for inherently not codable concepts without codable children: Secondary | ICD-10-CM

## 2013-08-25 LAB — URINALYSIS, ROUTINE W REFLEX MICROSCOPIC
Bilirubin Urine: NEGATIVE
Ketones, ur: NEGATIVE
LEUKOCYTES UA: NEGATIVE
NITRITE: NEGATIVE
PH: 6.5 (ref 5.0–8.0)
SPECIFIC GRAVITY, URINE: 1.015 (ref 1.000–1.030)
Total Protein, Urine: NEGATIVE
Urine Glucose: NEGATIVE
Urobilinogen, UA: 0.2 (ref 0.0–1.0)

## 2013-08-26 ENCOUNTER — Other Ambulatory Visit: Payer: Self-pay | Admitting: Nurse Practitioner

## 2013-08-26 DIAGNOSIS — R3129 Other microscopic hematuria: Secondary | ICD-10-CM

## 2013-09-01 NOTE — Progress Notes (Signed)
Patient appt 09/09/13 at [facility]

## 2014-01-22 ENCOUNTER — Encounter: Payer: Self-pay | Admitting: Nurse Practitioner

## 2014-01-22 DIAGNOSIS — R319 Hematuria, unspecified: Secondary | ICD-10-CM | POA: Insufficient documentation

## 2014-01-23 ENCOUNTER — Ambulatory Visit: Payer: No Typology Code available for payment source | Admitting: Nurse Practitioner

## 2014-01-29 ENCOUNTER — Ambulatory Visit (INDEPENDENT_AMBULATORY_CARE_PROVIDER_SITE_OTHER): Payer: No Typology Code available for payment source | Admitting: Nurse Practitioner

## 2014-01-29 ENCOUNTER — Encounter: Payer: Self-pay | Admitting: Nurse Practitioner

## 2014-01-29 VITALS — BP 118/81 | HR 79 | Temp 97.9°F | Ht 63.16 in | Wt 151.0 lb

## 2014-01-29 DIAGNOSIS — E559 Vitamin D deficiency, unspecified: Secondary | ICD-10-CM | POA: Insufficient documentation

## 2014-01-29 DIAGNOSIS — R61 Generalized hyperhidrosis: Secondary | ICD-10-CM

## 2014-01-29 DIAGNOSIS — L74519 Primary focal hyperhidrosis, unspecified: Secondary | ICD-10-CM

## 2014-01-29 LAB — VITAMIN D 25 HYDROXY (VIT D DEFICIENCY, FRACTURES): VITD: 22.01 ng/mL

## 2014-01-29 MED ORDER — ALUMINUM CHLORIDE 20 % EX SOLN: Freq: Every day | CUTANEOUS | Status: DC

## 2014-01-29 NOTE — Patient Instructions (Signed)
Use drysol as directed.  Develop lifelong habits of exercise most days of the week: take a 30 minute walk. The benefits include weight loss, lower risk for heart disease, diabetes, stroke, high blood pressure, lower rates of depression & dementia, better sleep quality & bone health. Good nutrition info: Eat to Live by Excell Seltzer. We will have flu shots at end of August. Schedule appointment for pelvic exam next year. Nice to see you!

## 2014-01-29 NOTE — Assessment & Plan Note (Signed)
Drysol. Educated on association of aluminum & alzheimers. Use least effective amount. Change deodorants.

## 2014-01-29 NOTE — Progress Notes (Signed)
Subjective:     Kristine [name] is a 33 y.o. female. She presents for follow up of reported elevated blood pressure readings accompanied by HA and  She also c/o axillary hyperhidrosis. We discussed urology work up for hematuria-nml and elevated LDL-discussed impact of nutrition & exercise. Emphasis on whole grains, no sugar & refined flour, fruits & vegetables should be half or more of each meal. Re elevated blood pressure readings accompanied by HA: she is getting readings under 130/85 & no HA.  Re vitamin D deficiency: she is taking 4000 iu daily. Re axillary hyperhidrosis: she has used several different deodorants. She is self-conscious about sweating and wants to know if there is a medication. We discussed the benefit & potential harm of drysol, also changing deodorants frequently, and using self-adhesive pad in clothing that can be purchased at fabric stores.    The following portions of the patient's history were reviewed and updated as appropriate: allergies, current medications, past medical history, past social history, past surgical history and problem list.  Review of Systems Pertinent items are noted in HPI.    Objective:    BP 118/81  Pulse 79  Temp(Src) 97.9 F (36.6 C) (Temporal)  Ht 5' 3.16" (1.604 m)  Wt 151 lb (68.493 kg)  BMI 26.62 kg/m2  SpO2 97%  LMP 01/15/2014 BP 118/81  Pulse 79  Temp(Src) 97.9 F (36.6 C) (Temporal)  Ht 5' 3.16" (1.604 m)  Wt 151 lb (68.493 kg)  BMI 26.62 kg/m2  SpO2 97%  LMP 01/15/2014 General appearance: alert, cooperative, appears stated age and no distress Head: Normocephalic, without obvious abnormality, atraumatic Eyes: negative findings: lids and lashes normal and conjunctivae and sclerae normal    Assessment:     1. Hyperhydrosis disorder axillary - aluminum chloride (DRYSOL) 20% external solution; Apply topically at bedtime. Apply at bedtime nightly, until excessive sweating decreases (usually 1-2 applications), then 1-2 week.   Dispense: 35 mL; Refill: 3  2. Unspecified vitamin D deficiency - Vit D  25 hydroxy (rtn osteoporosis monitoring)  See problem list for complete A&P See pt instructions F/u next yr for pelvic exam & vit D check.

## 2014-01-29 NOTE — Progress Notes (Signed)
Pre visit review using our clinic review tool, if applicable. No additional management support is needed unless otherwise documented below in the visit note. 

## 2014-01-29 NOTE — Assessment & Plan Note (Signed)
Taking 4000 iu daily D3. Check level today. Educated on associations of low D & cancers, autoimmune disease, & bone disease.

## 2014-01-30 ENCOUNTER — Telehealth: Payer: Self-pay | Admitting: Nurse Practitioner

## 2014-01-30 NOTE — Telephone Encounter (Signed)
pls call pt: Advise Vit D has almost doubled, but still low. Continue 4000 iu daily.

## 2014-01-30 NOTE — Telephone Encounter (Signed)
Patient aware.

## 2014-01-30 NOTE — Telephone Encounter (Signed)
LMOVM for pt to return call concerning results.

## 2014-03-19 ENCOUNTER — Ambulatory Visit (INDEPENDENT_AMBULATORY_CARE_PROVIDER_SITE_OTHER): Payer: No Typology Code available for payment source | Admitting: Nurse Practitioner

## 2014-03-19 ENCOUNTER — Encounter: Payer: Self-pay | Admitting: Nurse Practitioner

## 2014-03-19 VITALS — BP 133/87 | HR 80 | Temp 98.2°F | Resp 18 | Ht 63.0 in | Wt 151.0 lb

## 2014-03-19 DIAGNOSIS — B9689 Other specified bacterial agents as the cause of diseases classified elsewhere: Secondary | ICD-10-CM

## 2014-03-19 DIAGNOSIS — N760 Acute vaginitis: Secondary | ICD-10-CM

## 2014-03-19 DIAGNOSIS — A499 Bacterial infection, unspecified: Secondary | ICD-10-CM

## 2014-03-19 LAB — POCT WET PREP WITH KOH
Clue Cells Wet Prep HPF POC: POSITIVE
KOH Prep POC: NEGATIVE
RBC WET PREP PER HPF POC: NEGATIVE
Trichomonas, UA: NEGATIVE
YEAST WET PREP PER HPF POC: NEGATIVE

## 2014-03-19 LAB — POCT URINE PREGNANCY: Preg Test, Ur: NEGATIVE

## 2014-03-19 MED ORDER — METRONIDAZOLE 500 MG PO TABS
500.0000 mg | ORAL_TABLET | Freq: Two times a day (BID) | ORAL | Status: DC
Start: 2014-03-19 — End: 2015-01-22

## 2014-03-19 NOTE — Progress Notes (Signed)
   Subjective:    Patient ID: Kristine [name], female    DOB: [birth year], 33 y.o.   MRN: [id number]  Vaginal Discharge The patient's primary symptoms include a genital odor and a vaginal discharge. The patient's pertinent negatives include no genital itching, genital lesions, genital rash, missed menses, pelvic pain or vaginal bleeding. This is a new problem. The current episode started yesterday. The problem occurs constantly. The problem has been unchanged. The patient is experiencing no pain. She is not pregnant. Pertinent negatives include no abdominal pain, back pain, chills, diarrhea, discolored urine, dysuria, fever, flank pain, frequency, headaches, hematuria, nausea, rash, urgency or vomiting. The vaginal discharge was thin. There has been no bleeding. She has not been passing clots. She has not been passing tissue. Nothing aggravates the symptoms. Treatments tried: douche. The treatment provided no relief. She is sexually active (Just started on depo-provera 4 days ago.). No, her partner does not have an STD. She uses progestin injections for contraception. Her menstrual history has been regular.      Review of Systems  Constitutional: Negative for fever and chills.  Gastrointestinal: Negative for nausea, vomiting, abdominal pain and diarrhea.  Genitourinary: Positive for vaginal discharge. Negative for dysuria, urgency, frequency, hematuria, flank pain, pelvic pain and missed menses.  Musculoskeletal: Negative for back pain.  Skin: Negative for rash.  Neurological: Negative for headaches.       Objective:   Physical Exam  Vitals reviewed. Constitutional: She is oriented to person, place, and time. She appears well-developed and well-nourished.  HENT:  Head: Normocephalic and atraumatic.  Eyes: Conjunctivae are normal. Right eye exhibits no discharge. Left eye exhibits no discharge.  Cardiovascular: Normal rate.   Pulmonary/Chest: Effort normal. No respiratory distress.    Genitourinary: Vaginal discharge found.  Excoriated area. Odor. Brown thick cervical d/c.  Neurological: She is alert and oriented to person, place, and time.  Skin: Skin is warm and dry.  Psychiatric: She has a normal mood and affect. Her behavior is normal. Thought content normal.          Assessment & Plan:  1. Bacterial vaginosis - GC/Chlamydia Probe Amp - metroNIDAZOLE (FLAGYL) 500 MG tablet; Take 1 tablet (500 mg total) by mouth 2 (two) times daily.  Dispense: 14 tablet; Refill: 0 - POCT Wet Prep with KOH-clue cells, no yeast or trich - POCT urine pregnancy--neg  F/u PRN

## 2014-03-19 NOTE — Patient Instructions (Signed)
You have bacterial vaginosis. Start metronidazole as directed. No alcohol while taking medication or within 3 days of finishing.  No intercourse for 7 days. No douching.  We will call with lab results.  You may return to our office for subsequent depo-provera injections. Next injection is due in 3 months. You may return anytime between Nov 16 to Nov 30.Nice to see you. Let me know if symptoms do not resolve within 10 days.  Bacterial Vaginosis Bacterial vaginosis is a vaginal infection that occurs when the normal balance of bacteria in the vagina is disrupted. It results from an overgrowth of certain bacteria. This is the most common vaginal infection in women of childbearing age. Treatment is important to prevent complications, especially in pregnant women, as it can cause a premature delivery. CAUSES  Bacterial vaginosis is caused by an increase in harmful bacteria that are normally present in smaller amounts in the vagina. Several different kinds of bacteria can cause bacterial vaginosis. However, the reason that the condition develops is not fully understood. RISK FACTORS Certain activities or behaviors can put you at an increased risk of developing bacterial vaginosis, including:  Having a new sex partner or multiple sex partners.  Douching.  Using an intrauterine device (IUD) for contraception. Women do not get bacterial vaginosis from toilet seats, bedding, swimming pools, or contact with objects around them. SIGNS AND SYMPTOMS  Some women with bacterial vaginosis have no signs or symptoms. Common symptoms include:  Grey vaginal discharge.  A fishlike odor with discharge, especially after sexual intercourse.  Itching or burning of the vagina and vulva.  Burning or pain with urination. DIAGNOSIS  Your health care provider will take a medical history and examine the vagina for signs of bacterial vaginosis. A sample of vaginal fluid may be taken. Your health care provider will  look at this sample under a microscope to check for bacteria and abnormal cells. A vaginal pH test may also be done.  TREATMENT  Bacterial vaginosis may be treated with antibiotic medicines. These may be given in the form of a pill or a vaginal cream. A second round of antibiotics may be prescribed if the condition comes back after treatment.  HOME CARE INSTRUCTIONS   Only take over-the-counter or prescription medicines as directed by your health care provider.  If antibiotic medicine was prescribed, take it as directed. Make sure you finish it even if you start to feel better.  Do not have sex until treatment is completed.  Tell all sexual partners that you have a vaginal infection. They should see their health care provider and be treated if they have problems, such as a mild rash or itching.  Practice safe sex by using condoms and only having one sex partner. SEEK MEDICAL CARE IF:   Your symptoms are not improving after 3 days of treatment.  You have increased discharge or pain.  You have a fever. MAKE SURE YOU:   Understand these instructions.  Will watch your condition.  Will get help right away if you are not doing well or get worse. FOR MORE INFORMATION  Centers for Disease Control and Prevention, Division of STD Prevention: AppraiserFraud.fi American Sexual Health Association (ASHA): www.ashastd.org  Document Released: 07/03/2005 Document Revised: 04/23/2013 Document Reviewed: 02/12/2013 [facility] Patient Information 2015 [place], [place]. This information is not intended to replace advice given to you by your health care provider. Make sure you discuss any questions you have with your health care provider.  Medroxyprogesterone injection [Contraceptive] What is this medicine?  MEDROXYPROGESTERONE (me DROX ee proe JES te rone) contraceptive injections prevent pregnancy. They provide effective birth control for 3 months. Depo-subQ Provera 104 is also used for treating pain  related to endometriosis. This medicine may be used for other purposes; ask your health care provider or pharmacist if you have questions. COMMON BRAND NAME(S): Depo-Provera, Depo-subQ Provera 104 What should I tell my health care provider before I take this medicine? They need to know if you have any of these conditions: -frequently drink alcohol -asthma -blood vessel disease or a history of a blood clot in the lungs or legs -bone disease such as osteoporosis -breast cancer -diabetes -eating disorder (anorexia nervosa or bulimia) -high blood pressure -HIV infection or AIDS -kidney disease -liver disease -mental depression -migraine -seizures (convulsions) -stroke -tobacco smoker -vaginal bleeding -an unusual or allergic reaction to medroxyprogesterone, other hormones, medicines, foods, dyes, or preservatives -pregnant or trying to get pregnant -breast-feeding How should I use this medicine? Depo-Provera Contraceptive injection is given into a muscle. Depo-subQ Provera 104 injection is given under the skin. These injections are given by a health care professional. You must not be pregnant before getting an injection. The injection is usually given during the first 5 days after the start of a menstrual period or 6 weeks after delivery of a baby. Talk to your pediatrician regarding the use of this medicine in children. Special care may be needed. These injections have been used in female children who have started having menstrual periods. Overdosage: If you think you have taken too much of this medicine contact a poison control center or emergency room at once. NOTE: This medicine is only for you. Do not share this medicine with others. What if I miss a dose? Try not to miss a dose. You must get an injection once every 3 months to maintain birth control. If you cannot keep an appointment, call and reschedule it. If you wait longer than 13 weeks between Depo-Provera contraceptive  injections or longer than 14 weeks between Depo-subQ Provera 104 injections, you could get pregnant. Use another method for birth control if you miss your appointment. You may also need a pregnancy test before receiving another injection. What may interact with this medicine? Do not take this medicine with any of the following medications: -bosentan This medicine may also interact with the following medications: -aminoglutethimide -antibiotics or medicines for infections, especially rifampin, rifabutin, rifapentine, and griseofulvin -aprepitant -barbiturate medicines such as phenobarbital or primidone -bexarotene -carbamazepine -medicines for seizures like ethotoin, felbamate, oxcarbazepine, phenytoin, topiramate -modafinil -St. John's wort This list may not describe all possible interactions. Give your health care provider a list of all the medicines, herbs, non-prescription drugs, or dietary supplements you use. Also tell them if you smoke, drink alcohol, or use illegal drugs. Some items may interact with your medicine. What should I watch for while using this medicine? This drug does not protect you against HIV infection (AIDS) or other sexually transmitted diseases. Use of this product may cause you to lose calcium from your bones. Loss of calcium may cause weak bones (osteoporosis). Only use this product for more than 2 years if other forms of birth control are not right for you. The longer you use this product for birth control the more likely you will be at risk for weak bones. Ask your health care professional how you can keep strong bones. You may have a change in bleeding pattern or irregular periods. Many females stop having periods while taking this drug. If you  have received your injections on time, your chance of being pregnant is very low. If you think you may be pregnant, see your health care professional as soon as possible. Tell your health care professional if you want to get  pregnant within the next year. The effect of this medicine may last a long time after you get your last injection. What side effects may I notice from receiving this medicine? Side effects that you should report to your doctor or health care professional as soon as possible: -allergic reactions like skin rash, itching or hives, swelling of the face, lips, or tongue -breast tenderness or discharge -breathing problems -changes in vision -depression -feeling faint or lightheaded, falls -fever -pain in the abdomen, chest, groin, or leg -problems with balance, talking, walking -unusually weak or tired -yellowing of the eyes or skin Side effects that usually do not require medical attention (report to your doctor or health care professional if they continue or are bothersome): -acne -fluid retention and swelling -headache -irregular periods, spotting, or absent periods -temporary pain, itching, or skin reaction at site where injected -weight gain This list may not describe all possible side effects. Call your doctor for medical advice about side effects. You may report side effects to FDA at 1-800-FDA-1088. Where should I keep my medicine? This does not apply. The injection will be given to you by a health care professional. NOTE: This sheet is a summary. It may not cover all possible information. If you have questions about this medicine, talk to your doctor, pharmacist, or health care provider.  2015, Elsevier/Gold Standard. (2008-07-24 18:37:56)

## 2014-03-19 NOTE — Progress Notes (Signed)
Pre visit review using our clinic review tool, if applicable. No additional management support is needed unless otherwise documented below in the visit note. 

## 2014-03-20 ENCOUNTER — Telehealth: Payer: Self-pay | Admitting: Nurse Practitioner

## 2014-03-20 ENCOUNTER — Ambulatory Visit: Payer: No Typology Code available for payment source | Admitting: Nurse Practitioner

## 2014-03-20 LAB — GC/CHLAMYDIA PROBE AMP
CT Probe RNA: NEGATIVE
GC PROBE AMP APTIMA: NEGATIVE

## 2014-03-20 NOTE — Telephone Encounter (Signed)
Spoke with pt, advised lab result. Pt understood.

## 2014-03-20 NOTE — Telephone Encounter (Signed)
pls call pt: Advise GC neg.

## 2014-06-01 ENCOUNTER — Ambulatory Visit: Payer: No Typology Code available for payment source

## 2014-06-08 ENCOUNTER — Ambulatory Visit (INDEPENDENT_AMBULATORY_CARE_PROVIDER_SITE_OTHER): Payer: No Typology Code available for payment source

## 2014-06-08 DIAGNOSIS — Z308 Encounter for other contraceptive management: Secondary | ICD-10-CM

## 2014-06-08 MED ORDER — MEDROXYPROGESTERONE ACETATE 150 MG/ML IM SUSP
150.0000 mg | Freq: Once | INTRAMUSCULAR | Status: AC
  Administered 2014-06-08: 150 mg via INTRAMUSCULAR

## 2014-08-05 ENCOUNTER — Encounter: Payer: Self-pay | Admitting: Nurse Practitioner

## 2014-08-05 DIAGNOSIS — R079 Chest pain, unspecified: Secondary | ICD-10-CM | POA: Insufficient documentation

## 2014-08-25 ENCOUNTER — Ambulatory Visit: Payer: No Typology Code available for payment source

## 2014-09-03 ENCOUNTER — Ambulatory Visit (INDEPENDENT_AMBULATORY_CARE_PROVIDER_SITE_OTHER): Payer: No Typology Code available for payment source | Admitting: *Deleted

## 2014-09-03 DIAGNOSIS — Z308 Encounter for other contraceptive management: Secondary | ICD-10-CM

## 2014-09-03 MED ORDER — MEDROXYPROGESTERONE ACETATE 150 MG/ML IM SUSP
150.0000 mg | Freq: Once | INTRAMUSCULAR | Status: AC
  Administered 2014-09-03: 150 mg via INTRAMUSCULAR

## 2014-11-24 ENCOUNTER — Ambulatory Visit: Payer: No Typology Code available for payment source

## 2015-01-22 ENCOUNTER — Encounter: Payer: Self-pay | Admitting: Family Medicine

## 2015-01-22 ENCOUNTER — Ambulatory Visit (INDEPENDENT_AMBULATORY_CARE_PROVIDER_SITE_OTHER): Payer: No Typology Code available for payment source | Admitting: Family Medicine

## 2015-01-22 VITALS — BP 124/87 | HR 72 | Temp 98.8°F | Resp 16 | Ht 63.0 in | Wt 157.0 lb

## 2015-01-22 DIAGNOSIS — I10 Essential (primary) hypertension: Secondary | ICD-10-CM | POA: Diagnosis not present

## 2015-01-22 LAB — TSH: TSH: 0.608 u[IU]/mL (ref 0.350–4.500)

## 2015-01-22 LAB — BASIC METABOLIC PANEL
BUN: 11 mg/dL (ref 6–23)
CHLORIDE: 100 meq/L (ref 96–112)
CO2: 24 mEq/L (ref 19–32)
Calcium: 9.9 mg/dL (ref 8.4–10.5)
Creat: 0.8 mg/dL (ref 0.50–1.10)
Glucose, Bld: 90 mg/dL (ref 70–99)
POTASSIUM: 3.8 meq/L (ref 3.5–5.3)
Sodium: 135 mEq/L (ref 135–145)

## 2015-01-22 MED ORDER — AMLODIPINE BESYLATE 10 MG PO TABS: 10.0000 mg | ORAL_TABLET | Freq: Every day | ORAL | Status: DC

## 2015-01-22 NOTE — Progress Notes (Signed)
Pre visit review using our clinic review tool, if applicable. No additional management support is needed unless otherwise documented below in the visit note. 

## 2015-01-22 NOTE — Progress Notes (Signed)
OFFICE NOTE  01/22/2015  CC:  Chief Complaint  Patient presents with  . Hypertension  . Headache   HPI: Patient is a 34 y.o. African-American female who is here for recent elevation of BP outside of the office. Feeling HA's lately, checked bp and it was up. Numbers consistently 150s avg over 90s avg (high up to 170 over low 100s). These checks are from the last few days.  Has home bp cuff + gets checked at work. No vision c/o, no CP, no palp's, no new meds.  Takes only occ NSAID prn HA.  No decongestants, only occ caffeinated drink. Has FH HTN: mom.   Was on depo but most recent injection was 08/2014. Not sexually active, says no chance she is pregnant.  Pertinent PMH:  Past Medical History  Diagnosis Date  . Chicken pox   . Frequent headaches   . Elevated blood pressure reading   Urge incontinence  Past Surgical History  Procedure Laterality Date  . Cesarean section  2001   History   Social History Narrative   Ms. Parkerson lives with her son in [place]. 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    MEDS:  NONE  PE: Blood pressure 124/87, pulse 72, temperature 98.8 F (37.1 C), temperature source Temporal, resp. rate 16, height 5\' 3"  (1.6 m), weight 157 lb (71.215 kg), SpO2 98 %. Gen: Alert, well appearing.  Patient is oriented to person, place, time, and situation. NWG:NFAO: no injection, icteris, swelling, or exudate.  EOMI, PERRLA. Mouth: lips without lesion/swelling.  Oral mucosa pink and moist. Oropharynx without erythema, exudate, or swelling. CV: RRR, no m/r/g.   LUNGS: CTA bilat, nonlabored resps, good aeration in all lung fields. ABD: soft, NT, ND, BS normal.  No hepatospenomegaly or mass.  No bruits. EXT: no clubbing, cyanosis, or edema.    LABS: Lab Results  Component Value Date   TSH 0.78 07/21/2013    IMPRESSION AND PLAN:  Essential HTN, new dx: start amlodipine 10mg  qd. Check BMET and TSH. DASH diet handout given/discussed. Exercise  discussed, avoidance of excessive NSAIDs and avoidance of OTC decongestants discussed.  An After Visit Summary was printed and given to the patient.  FOLLOW UP: 1 wk

## 2015-01-29 ENCOUNTER — Ambulatory Visit: Payer: No Typology Code available for payment source | Admitting: Family Medicine

## 2015-02-03 ENCOUNTER — Ambulatory Visit (INDEPENDENT_AMBULATORY_CARE_PROVIDER_SITE_OTHER): Payer: No Typology Code available for payment source | Admitting: Family Medicine

## 2015-02-03 ENCOUNTER — Encounter: Payer: Self-pay | Admitting: Family Medicine

## 2015-02-03 VITALS — BP 110/80 | HR 92 | Temp 99.1°F | Resp 16 | Ht 63.0 in | Wt 158.0 lb

## 2015-02-03 DIAGNOSIS — I10 Essential (primary) hypertension: Secondary | ICD-10-CM

## 2015-02-03 NOTE — Progress Notes (Signed)
Pre visit review using our clinic review tool, if applicable. No additional management support is needed unless otherwise documented below in the visit note. 

## 2015-02-03 NOTE — Progress Notes (Signed)
OFFICE VISIT  02/03/2015   CC:  Chief Complaint  Patient presents with  . Follow-up    1 week f/u for HTN, pt not fasting.   HPI:    Patient is a 34 y.o. African-American female who presents for 2 wk f/u HTN. New dx at last visit, started amlodipine 10mg  qd. Home monitoring shows 130s/80s, HR 70s-80s. Minimal LE swelling x 1 day, o/w no side effects.  Past Medical History  Diagnosis Date  . Chicken pox   . Frequent headaches   . Elevated blood pressure reading     Past Surgical History  Procedure Laterality Date  . Cesarean section  2001    Outpatient Prescriptions Prior to Visit  Medication Sig Dispense Refill  . amLODipine (NORVASC) 10 MG tablet Take 1 tablet (10 mg total) by mouth daily. 30 tablet 6   No facility-administered medications prior to visit.    No Known Allergies  ROS As per HPI  PE: Blood pressure 110/80, pulse 92, temperature 99.1 F (37.3 C), temperature source Oral, resp. rate 16, height 5\' 3"  (1.6 m), weight 158 lb (71.668 kg), SpO2 99 %. Gen: Alert, well appearing.  Patient is oriented to person, place, time, and situation. CV: RRR, no m/r/g.   LUNGS: CTA bilat, nonlabored resps, good aeration in all lung fields. EXT: no clubbing, cyanosis, or edema.    LABS:    Chemistry      Component Value Date/Time   NA 135 01/22/2015 1621   K 3.8 01/22/2015 1621   CL 100 01/22/2015 1621   CO2 24 01/22/2015 1621   BUN 11 01/22/2015 1621   CREATININE 0.80 01/22/2015 1621   CREATININE 0.7 07/21/2013 0902      Component Value Date/Time   CALCIUM 9.9 01/22/2015 1621   ALKPHOS 64 07/21/2013 0902   AST 22 07/21/2013 0902   ALT 27 07/21/2013 0902   BILITOT 0.6 07/21/2013 0902     Lab Results  Component Value Date   TSH 0.608 01/22/2015     IMPRESSION AND PLAN:  HTN: relatively new dx.  Well controlled on amlodipine 10mg  daily. Continue med, diet, exercise. Continue home bp monitoring.  An After Visit Summary was printed and given to  the patient.  FOLLOW UP: Return in about 6 months (around 08/06/2015) for HTN.

## 2015-03-10 ENCOUNTER — Ambulatory Visit (INDEPENDENT_AMBULATORY_CARE_PROVIDER_SITE_OTHER): Payer: No Typology Code available for payment source | Admitting: Family Medicine

## 2015-03-10 ENCOUNTER — Encounter: Payer: Self-pay | Admitting: Family Medicine

## 2015-03-10 VITALS — BP 118/80 | HR 76 | Temp 98.5°F | Resp 16 | Ht 63.0 in | Wt 156.0 lb

## 2015-03-10 DIAGNOSIS — F419 Anxiety disorder, unspecified: Secondary | ICD-10-CM

## 2015-03-10 DIAGNOSIS — G4763 Sleep related bruxism: Secondary | ICD-10-CM | POA: Diagnosis not present

## 2015-03-10 MED ORDER — BUSPIRONE HCL 7.5 MG PO TABS: 7.5000 mg | ORAL_TABLET | Freq: Two times a day (BID) | ORAL | Status: DC

## 2015-03-10 MED ORDER — NAPROXEN 500 MG PO TABS: 500.0000 mg | ORAL_TABLET | Freq: Two times a day (BID) | ORAL | Status: DC

## 2015-03-10 MED ORDER — CYCLOBENZAPRINE HCL 5 MG PO TABS: 5.0000 mg | ORAL_TABLET | Freq: Three times a day (TID) | ORAL | Status: DC | PRN

## 2015-03-10 NOTE — Progress Notes (Signed)
Pre visit review using our clinic review tool, if applicable. No additional management support is needed unless otherwise documented below in the visit note. 

## 2015-03-10 NOTE — Patient Instructions (Signed)
Teeth Grinding (Bruxism) Many people grind or clench their teeth during the day or night. Most are unaware they are doing it. Teeth grinding may be triggered by teeth being improperly aligned (malocclusion). Other causes may be:  Stress.  Worry.  Sleep disorders. Teeth grinding sometimes causes muscle spasms and headaches. Most often, these headaches are classified as tension type. Teeth grinding may also lead to:  Jaw problems (temporomandibular joint disorder [TMJ]).  Facial pain.  Damaged teeth. Teeth grinding is quite common in children, usually without pain. Most of the time it will disappear by adolescence. TREATMENT  Seek dental care if you or your child has:  Pain.  Trouble when opening the mouth.  Damage to teeth. A dentist often can ease those symptoms by fitting a small splint or mouth guard (appliance) to the upper or lower teeth. It is usually worn during sleep. Medications and other stress reduction techniques are sometimes used. Document Released: 07/06/2003 Document Revised: 09/25/2011 Document Reviewed: 09/29/2009 [facility] Patient Information 2015 [place], [place]. This information is not intended to replace advice given to you by your health care provider. Make sure you discuss any questions you have with your health care provider.  Need dental home.  Flexeril for night, every night for next 5 days.  Ice as needed. Naproxen for 10 days Buspirone two times a day, f/u 3-4 weeks.

## 2015-03-10 NOTE — Progress Notes (Signed)
Subjective:    Patient ID: Kristine [name], female    DOB: 1981/05/08, 34 y.o.   MRN: [id number]  HPI  Jaw pain: Patient reports 2 days ago the right side of her face was swollen, and she had pain in her jaw joint. She had radiating pain to her and her ear, and her teeth also hurt. She denies fever, chills nausea, vomiting, diarrhea, rash, sinus pressure, congestion, sore throat or rhinorrhea. Patient endorses noticing she grinds her teeth for the past 2 months. She has never done this prior to her knowledge. She feels it's mostly during her sleep, she finds herself waking up with clenched jaw. She reports yesterday her jaw was so sore, she was having trouble eating or opening her mouth fully. She notices some improvement today, she took Advil for comfort. Patient does admit to increased anxiety (see below). She denies any teeth trauma. She does not have a dental home.  Anxiety: Patient reports that her anxiety has increased over the past year. She has been recently placed on a hypertensive medication. She feels that much of her anxiety surrounds the stress from her work environment. She finds herself waking up dreaming about work-related stress. She has noticed grinding her teeth over the past 2 months during sleep. She has a young son. 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 She feels her anxiety is now affecting her daily life.   Past Medical History  Diagnosis Date  . Chicken pox   . Frequent headaches   . Elevated blood pressure reading    No Known Allergies Past Surgical History  Procedure Laterality Date  . Cesarean section  2001   Family History  Problem Relation Age of Onset  . Hypertension Mother   . Celiac disease Son    Social History   Social History  . Marital Status: Single    Spouse Name: N/A  . Number of Children: 1  . Years of Education: College   Occupational History  . [employer]   Social History Main Topics    . Smoking status: Never Smoker   . Smokeless tobacco: Never Used  . Alcohol Use: Yes     Comment: socially  . Drug Use: No  . Sexual Activity: No   Other Topics Concern  . Not on file   Social History Narrative   Kristine [name] lives with her son in [place]. 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    Review of Systems Negative, with the exception of above mentioned in HPI     Objective:   Physical Exam BP 118/80 mmHg  Pulse 76  Temp(Src) 98.5 F (36.9 C) (Oral)  Resp 16  Ht 5\' 3"  (1.6 m)  Wt 156 lb (70.761 kg)  BMI 27.64 kg/m2  SpO2 98% Gen: Afebrile. No acute distress. Nontoxic in appearance, well-developed, well-nourished, mildly overweight female.[place]. Good eye contact. HENT: AT. [place]. Bilateral TM visualized and normal in appearance.  MMM. Bilateral nares without erythema or swelling. Throat without erythema or exudates. Full range of motion of child, without clicking or popping. Tenderness to palpation of her right TMJ joint. Mild fullness right TMJ joint. Mild soft tissue swelling. Mouth: Teeth intact, no obvious trauma, erythema or drainage. Mild discomfort on biting on tongue blade, diffusely. Neck: Supple, no lymphadenopathy MSK: No TTP. Neck full ROM. No discomfort.  Psych: Mildly anxious. Picking at her finger nails. Normal  dress and demeanor. Normal speech. Normal  thought content and judgment..     Assessment & Plan:  Kristine [name] is a  34 y.o. female with acute jaw pain, with bruxism.Discussed teeth grinding, and anxiety as a possible component of new onset teeth grinding. Patient seems to feel that her anxiety is much increased. 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 Moderate anxiety - Discussed anxiety treatment with patient today, she would like to try a medication to help her with her anxiety. Buspirone 7.5 mg twice a day started, patient to follow-up in 3 weeks to discuss if need  taper. - busPIRone (BUSPAR) 7.5 MG tablet; Take 1 tablet (7.5 mg total) by mouth 2 (two) times daily.  Dispense: 60 tablet; Refill: 0 - F/u 3 weeks  2. Bruxism, sleep-related - Naproxen 500 mg twice a day for 10 days. Flexeril daily at bedtime, and when necessary through the day twice a day if needed. Caution on sedation with muscle relaxant given. Ice jawline as needed. Never apply ice strictly to skin.  - cyclobenzaprine (FLEXERIL) 5 MG tablet; Take 1 tablet (5 mg total) by mouth 3 (three) times daily as needed for muscle spasms.  Dispense: 30 tablet; Refill: 1 - naproxen (NAPROSYN) 500 MG tablet; Take 1 tablet (500 mg total) by mouth 2 (two) times daily with a meal.  Dispense: 60 tablet; Refill: 0 - f/u 3 weeks

## 2015-03-31 ENCOUNTER — Encounter: Payer: Self-pay | Admitting: Family Medicine

## 2015-03-31 ENCOUNTER — Ambulatory Visit (INDEPENDENT_AMBULATORY_CARE_PROVIDER_SITE_OTHER): Payer: No Typology Code available for payment source | Admitting: Family Medicine

## 2015-03-31 VITALS — BP 112/79 | HR 83 | Temp 98.4°F | Resp 16 | Ht 63.0 in | Wt 158.0 lb

## 2015-03-31 DIAGNOSIS — G4763 Sleep related bruxism: Secondary | ICD-10-CM

## 2015-03-31 DIAGNOSIS — F419 Anxiety disorder, unspecified: Secondary | ICD-10-CM

## 2015-03-31 MED ORDER — BUSPIRONE HCL 7.5 MG PO TABS: 15.0000 mg | ORAL_TABLET | Freq: Two times a day (BID) | ORAL | Status: DC

## 2015-03-31 MED ORDER — BUSPIRONE HCL 15 MG PO TABS: 15.0000 mg | ORAL_TABLET | Freq: Two times a day (BID) | ORAL | Status: DC

## 2015-03-31 NOTE — Progress Notes (Signed)
Pre visit review using our clinic review tool, if applicable. No additional management support is needed unless otherwise documented below in the visit note. 

## 2015-03-31 NOTE — Patient Instructions (Addendum)
I am increasing your dose of Buspar to 15 mg 2x a day. I will need to see you in 4-6 weeks to follow on your progress. If at that time you feel yu need somethin gmore to help with anxiety we can discuss additional medication or different medication.  Take 10 mg of flexeril at night (2 pills) and see if that is more helpful, if you need refills I will refill at 10 mg dose. Continue the naproxen as needed.  If you teeth become sore, damaged or we can not break the cycle you may need to call your dentist and have teeth guards made.  Generalized Anxiety Disorder Generalized anxiety disorder (GAD) is a mental disorder. It interferes with life functions, including relationships, work, and school. GAD is different from normal anxiety, which everyone experiences at some point in their lives in response to specific life events and activities. Normal anxiety actually helps [place] prepare for and get through these life events and activities. Normal anxiety goes away after the event or activity is over.  GAD causes anxiety that is not necessarily related to specific events or activities. It also causes excess anxiety in proportion to specific events or activities. The anxiety associated with GAD is also difficult to control. GAD can vary from mild to severe. People with severe GAD can have intense waves of anxiety with physical symptoms (panic attacks).  SYMPTOMS The anxiety and worry associated with GAD are difficult to control. This anxiety and worry are related to many life events and activities and also occur more days than not for 6 months or longer. People with GAD also have three or more of the following symptoms (one or more in children):  Restlessness.   Fatigue.  Difficulty concentrating.   Irritability.  Muscle tension.  Difficulty sleeping or unsatisfying sleep. DIAGNOSIS GAD is diagnosed through an assessment by your health care provider. Your health care provider will ask you questions  aboutyour mood,physical symptoms, and events in your life. Your health care provider may ask you about your medical history and use of alcohol or drugs, including prescription medicines. Your health care provider may also do a physical exam and blood tests. Certain medical conditions and the use of certain substances can cause symptoms similar to those associated with GAD. Your health care provider may refer you to a mental health specialist for further evaluation. TREATMENT The following therapies are usually used to treat GAD:   Medication. Antidepressant medication usually is prescribed for long-term daily control. Antianxiety medicines may be added in severe cases, especially when panic attacks occur.   Talk therapy (psychotherapy). Certain types of talk therapy can be helpful in treating GAD by providing support, education, and guidance. A form of talk therapy called cognitive behavioral therapy can teach you healthy ways to think about and react to daily life events and activities.  Stress managementtechniques. These include yoga, meditation, and exercise and can be very helpful when they are practiced regularly. A mental health specialist can help determine which treatment is best for you. Some people see improvement with one therapy. However, other people require a combination of therapies. Document Released: 10/28/2012 Document Revised: 11/17/2013 Document Reviewed: 10/28/2012 [facility] Patient Information 2015 [place], [place]. This information is not intended to replace advice given to you by your health care provider. Make sure you discuss any questions you have with your health care provider.

## 2015-03-31 NOTE — Progress Notes (Signed)
Subjective:    Patient ID: Kristine [name], female    DOB: 01-09-1981, 34 y.o.   MRN: [id number]  HPI  Moderate anxiety - Mildly improved, still anxious today and pt still feels like she is struggling with her anxiety.  - Patient may need Ativan/Klonopin versus Paxil for her anxiety, if increased dose BuSpar is not effective. - Education in the format of AVS given on anxiety -Patient encouraged to take at least 30 minutes to herself to relax daily (yoga/medication/exercise etc.) - busPIRone (BUSPAR) 15 mg BID - Follow-up 4-6 weeks  Bruxism, sleep-related - Patient still grinding teeth. Jaw pain improved, but still present. Encouraged patient to take 10 mg of Flexeril daily at bedtime, if this works better for her and will call in a increased just as her prior dose was 5 mg. Increasing the BuSpar will help with the anxiety related symptoms.  Health maintenance:  Colonoscopy: No family history or personal history of colon polyps/colon cancer/IBD: Routine screening at age 14 Mammogram: No family or personal history of colon cancer: Routine screening to start at age 49-50 Cervical cancer screening: Last Pap 2013, patient due for Pap smear. Last Pap 2013 did not include transition zone per chart review Immunizations: Flu shot given to her employment, tetanus up-to-date. Infectious disease screening: HIV screening needed   Review of Systems Negative, with the exception of above mentioned in HPI      Objective:   Physical Exam BP 112/79 mmHg  Pulse 83  Temp(Src) 98.4 F (36.9 C) (Temporal)  Resp 16  Ht 5\' 3"  (1.6 m)  Wt 158 lb (71.668 kg)  BMI 28.00 kg/m2  SpO2 99% Gen: Afebrile. No acute distress. Nontoxic in appearance, well-developed, well-nourished African-American female. Very pleasant HENT: AT. [place]. Bilateral TM visualized and normal in appearance. MMM. teeth intact, no cracked teeth or erythemic/bleeding gums. No facial swelling  Eyes:Pupils Equal Round Reactive to light,  Extraocular movements intact, Conjunctiva without redness, discharge or icterus. Psych: Normal dress. Normal speech. Normal thought content and judgment. Pleasant demeanor, moderately anxious, psychomotor agitation present.     Assessment & Plan:  1. Moderate anxiety - Mildly improved, still anxious today and pt still feels like she is struggling with her anxiety.  - Patient may need Ativan/Klonopin versus Paxil for her anxiety, if increased dose BuSpar is not effective. - Education in the format of AVS given on anxiety -Patient encouraged to take at least 30 minutes to herself to relax daily (yoga/medication/exercise etc.) - busPIRone (BUSPAR) 15 mg BID - Follow-up 4-6 weeks  2. Bruxism, sleep-related - Patient still grinding teeth. Jaw pain improved, but still present. Encouraged patient to take 10 mg of Flexeril daily at bedtime, if this works better for her and will call in a increased just as her prior dose was 5 mg. Increasing the BuSpar will help with the anxiety related symptoms.  3. Health maintenance:  Colonoscopy: No family history or personal history of colon polyps/colon cancer/IBD: Routine screening at age 44 Mammogram: No family or personal history of colon cancer: Routine screening to start at age 46-50 Cervical cancer screening: Last Pap 2013, patient due for Pap smear. Last Pap 2013 did not include transition zone per chart review; patient encouraged to schedule Pap smear within the next few months. Immunizations: Flu shot given to her employment, tetanus up-to-date. Infectious disease screening: HIV screening will be offered during her Pap smear appointment  Follow up: 4-6 weeks for anxiety/bruxism. If patient desires we can complete her Pap smear at  that time and offer HIV screening.

## 2015-04-28 ENCOUNTER — Encounter: Payer: Self-pay | Admitting: Family Medicine

## 2015-04-28 ENCOUNTER — Ambulatory Visit (INDEPENDENT_AMBULATORY_CARE_PROVIDER_SITE_OTHER): Payer: No Typology Code available for payment source | Admitting: Family Medicine

## 2015-04-28 VITALS — BP 125/82 | HR 79 | Temp 98.2°F | Resp 18 | Ht 62.0 in | Wt 153.0 lb

## 2015-04-28 DIAGNOSIS — Z0000 Encounter for general adult medical examination without abnormal findings: Secondary | ICD-10-CM | POA: Diagnosis not present

## 2015-04-28 DIAGNOSIS — I10 Essential (primary) hypertension: Secondary | ICD-10-CM

## 2015-04-28 DIAGNOSIS — F419 Anxiety disorder, unspecified: Secondary | ICD-10-CM

## 2015-04-28 DIAGNOSIS — G4763 Sleep related bruxism: Secondary | ICD-10-CM

## 2015-04-28 MED ORDER — LORAZEPAM 0.5 MG PO TABS: 0.5000 mg | ORAL_TABLET | Freq: Two times a day (BID) | ORAL | Status: DC | PRN

## 2015-04-28 MED ORDER — PAROXETINE HCL 20 MG PO TABS: 20.0000 mg | ORAL_TABLET | Freq: Every day | ORAL | Status: DC

## 2015-04-28 MED ORDER — CYCLOBENZAPRINE HCL 10 MG PO TABS: 10.0000 mg | ORAL_TABLET | Freq: Two times a day (BID) | ORAL | Status: DC | PRN

## 2015-04-28 NOTE — Patient Instructions (Signed)
Generalized Anxiety Disorder Generalized anxiety disorder (GAD) is a mental disorder. It interferes with life functions, including relationships, work, and school. GAD is different from normal anxiety, which everyone experiences at some point in their lives in response to specific life events and activities. Normal anxiety actually helps [place] prepare for and get through these life events and activities. Normal anxiety goes away after the event or activity is over.  GAD causes anxiety that is not necessarily related to specific events or activities. It also causes excess anxiety in proportion to specific events or activities. The anxiety associated with GAD is also difficult to control. GAD can vary from mild to severe. People with severe GAD can have intense waves of anxiety with physical symptoms (panic attacks).  SYMPTOMS The anxiety and worry associated with GAD are difficult to control. This anxiety and worry are related to many life events and activities and also occur more days than not for 6 months or longer. People with GAD also have three or more of the following symptoms (one or more in children):  Restlessness.   Fatigue.  Difficulty concentrating.   Irritability.  Muscle tension.  Difficulty sleeping or unsatisfying sleep. DIAGNOSIS GAD is diagnosed through an assessment by your health care provider. Your health care provider will ask you questions aboutyour mood,physical symptoms, and events in your life. Your health care provider may ask you about your medical history and use of alcohol or drugs, including prescription medicines. Your health care provider may also do a physical exam and blood tests. Certain medical conditions and the use of certain substances can cause symptoms similar to those associated with GAD. Your health care provider may refer you to a mental health specialist for further evaluation. TREATMENT The following therapies are usually used to treat GAD:    Medication. Antidepressant medication usually is prescribed for long-term daily control. Antianxiety medicines may be added in severe cases, especially when panic attacks occur.   Talk therapy (psychotherapy). Certain types of talk therapy can be helpful in treating GAD by providing support, education, and guidance. A form of talk therapy called cognitive behavioral therapy can teach you healthy ways to think about and react to daily life events and activities.  Stress managementtechniques. These include yoga, meditation, and exercise and can be very helpful when they are practiced regularly. A mental health specialist can help determine which treatment is best for you. Some people see improvement with one therapy. However, other people require a combination of therapies.   This information is not intended to replace advice given to you by your health care provider. Make sure you discuss any questions you have with your health care provider.   Document Released: 10/28/2012 Document Revised: 07/24/2014 Document Reviewed: 10/28/2012 [employer] Interactive Patient Education [employer].  4 weeks.

## 2015-04-28 NOTE — Progress Notes (Signed)
Subjective:    Patient ID: Kristine [name], female    DOB: 03-15-1981, 34 y.o.   MRN: [id number]  HPI  Bruxism: Patient states she is taking the 10 mg of Flexeril before bed, this has improved her sleep. She has noticed a decrease in her teeth grinding. She reports she does not become sedated with when necessary milligram of Flexeril, and she is able to tolerate it during the day if needed. She reports she has woken up on occasions and has had clenched teeth, but she doesn't feel like she is grinding as frequently. She denies any neck muscle tenderness or tooth pain. She has not gone to her dentist as of yet for evaluation.  Anxiety: Patient feels that the BuSpar is not effective for her even at the higher milligram dose. She would like to try another medication if possible. She is open to taking a daily medication. Patient has not been tried on any other medications prior to the trial of BuSpar. She reports that most of her anxiety is surrounding work, taking care of her young son and attending paralegal school. She will graduate in March from her college, at that time she'll be able to transition into hopefully less stressful job.   Hypertension: Patient is currently taking 10 mg of amlodipine daily. She does not routinely check her blood pressure in the outpatient setting, however she has taken it once or twice within the past 3 months and states that is always normal, never above 272 systolic or 90 diastolic, and never low. Patient does watch the salt content in her diet. She does not exercise regularly. She denies any chest pain, shortness of breath, visual changes, dizziness or headache. She does endorse lower extremity bilateral edema that occurred last week for 2 days, and the result spontaneously. Patient does have a family history of hypertension and her mother. She does have increased anxiety, that is likely contributing to her condition as well. Patient is wondering if she needs to be on  medication, amlodipine, for the rest of her life.   Health maintenance:  Colonoscopy: No family,hx colon cancer, routine screening at 57 Mammogram: No FHX, routine screening 45-50 Cervical cancer screening: Last PAP 2013 without adequate specimen. Patient encouraged to make an appointment to have completed. Pt has had abnl pap in the past, with colposcopy. She reports normal PAPs since colopo. Immunizations: Flu UTD. Td UTD.  Infectious disease screening: Chlamydia completed. HIV needs offered at PAP appointment or next lab draw.    Past Medical History  Diagnosis Date  . Chicken pox   . Frequent headaches   . Elevated blood pressure reading    No Known Allergies Social History   Social History  . Marital Status: Single    Spouse Name: N/A  . Number of Children: 1  . Years of Education: College   Occupational History  . [employer]   Social History Main Topics  . Smoking status: Never Smoker   . Smokeless tobacco: Never Used  . Alcohol Use: Yes     Comment: socially  . Drug Use: No  . Sexual Activity: No   Other Topics Concern  . Not on file   Social History Narrative   Kristine [name] lives with her son in [place]. 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   Family History  Problem Relation Age of Onset  . Hypertension Mother   . Celiac disease Son  Review of Systems Negative, with the exception of above mentioned in HPI    Objective:   Physical Exam BP 125/82 mmHg  Pulse 79  Temp(Src) 98.2 F (36.8 C) (Temporal)  Resp 18  Ht 5\' 2"  (1.575 m)  Wt 153 lb (69.4 kg)  BMI 27.98 kg/m2  SpO2 98% Gen: Afebrile. No acute distress. Nontoxic in appearance, well-developed, well-nourished, African-American female. Very pleasant.  Eyes:Pupils Equal Round Reactive to light, Extraocular movements intact,  Conjunctiva without redness, discharge or icterus. CV: RRR murmur appreciated, no edema, +2/4 P posterior tibialis  pulses Psych: moderately anxious, normal dress. Normal speech. Normal thought content and judgment..    Assessment & Plan:  1. Bruxism, sleep-related - Refilled on Flexeril 10 mg today, patient to take as needed. She was encouraged to follow-up with her dentist.  - NSAIDs as needed - cyclobenzaprine (FLEXERIL) 10 MG tablet; Take 1 tablet (10 mg total) by mouth 2 (two) times daily as needed for muscle spasms.  Dispense: 60 tablet; Refill: 2  2. Moderate anxiety -  discontinue BuSpar. - Discussed different types of medication with patient today, given options and side effect profiles of each. Patient decided on attempting Paxil. Will start at 20 mg for 4 weeks, patient to follow-up at that time, if needed can taper to 40 mg dose.  - patient also counseled on Ativan use, to be used only as needed, sparingly and for short-term until Paxil therapeutic level has been achieved. Patient in understanding. - PARoxetine (PAXIL) 20 MG tablet; Take 1 tablet (20 mg total) by mouth daily.  Dispense: 30 tablet; Refill: 0 - LORazepam (ATIVAN) 0.5 MG tablet; Take 1 tablet (0.5 mg total) by mouth 2 (two) times daily as needed for anxiety.  Dispense: 30 tablet; Refill: 1 - Follow-up in 4 weeks, if tolerating 20 mg dose with follow in 6 months, if needed to increase to 40 mg dose of Paxil with follow-up in 3 months.   3. Essential hypertension - Discussed with patient I would like her to monitor her blood pressure to 3 times a week until she sees me again. She is to record these and bring them with her on her next visit. -Discussed with her that part of her hypertension could be related to stress, and once we have her stress under more control and she transitions to a new job she may find that she needs less hypertension medications. That being said she also does have a family history of hypertension her mother, and she may need to be on medication regardless of stress level. - Encourage patient to continue eating a  low-salt diet, and try to increase her exercise to 150 minutes a week.  4. Health maintenance:  Colonoscopy: No family,hx colon cancer, routine screening at 53 Mammogram: No FHX, routine screening 45-50 Cervical cancer screening: Last PAP 2013 without adequate specimen. Patient encouraged to make an appointment to have completed.  Immunizations: Flu UTD. Td UTD.  Infectious disease screening: Chlamydia completed. HIV needs offered at PAP appointment or next lab draw.   Follow-up 4 weeks   > 25 minutes spent with patient, >50% of time spent face to face counseling patient and coordinating care.

## 2015-05-26 ENCOUNTER — Ambulatory Visit (INDEPENDENT_AMBULATORY_CARE_PROVIDER_SITE_OTHER): Payer: No Typology Code available for payment source | Admitting: Family Medicine

## 2015-05-26 ENCOUNTER — Other Ambulatory Visit (HOSPITAL_COMMUNITY)
Admission: RE | Admit: 2015-05-26 | Discharge: 2015-05-26 | Disposition: A | Discharge status: Home / Self Care | Payer: No Typology Code available for payment source | Source: Ambulatory Visit | Attending: Family Medicine | Admitting: Family Medicine

## 2015-05-26 ENCOUNTER — Encounter: Payer: Self-pay | Admitting: Family Medicine

## 2015-05-26 VITALS — BP 124/85 | HR 88 | Temp 98.2°F | Resp 20 | Wt 157.8 lb

## 2015-05-26 DIAGNOSIS — F419 Anxiety disorder, unspecified: Secondary | ICD-10-CM | POA: Diagnosis not present

## 2015-05-26 DIAGNOSIS — Z124 Encounter for screening for malignant neoplasm of cervix: Secondary | ICD-10-CM | POA: Diagnosis not present

## 2015-05-26 DIAGNOSIS — Z01419 Encounter for gynecological examination (general) (routine) without abnormal findings: Secondary | ICD-10-CM

## 2015-05-26 DIAGNOSIS — Z1151 Encounter for screening for human papillomavirus (HPV): Secondary | ICD-10-CM | POA: Insufficient documentation

## 2015-05-26 MED ORDER — PAROXETINE HCL 20 MG PO TABS: 20.0000 mg | ORAL_TABLET | Freq: Every day | ORAL | Status: DC

## 2015-05-26 NOTE — Progress Notes (Signed)
Subjective:    Patient ID: Kristine [name], female    DOB: July 04, 1981, 34 y.o.   MRN: [id number]  HPI Patient presents for follow-up on starting new anxiety medication 4 weeks ago and need of cervical cancer screening/Pap.  Cervical cancer screening: Cervical cancer screening: Last PAP 2013 without adequate specimen, records reviewed.  Pt has had abnl pap in the past, with colposcopy. She reports normal PAPs since colopo more than 3x. Her menses occurs approx. every 30 days wiht 1 day of heavy bleeding, then light for 4-5 days. She does endorse heavy cramps prior to her menses. She is not sexually active and not on birth control. She has a 71 year old son.Marland Kitchen No fhx breast cancer, no fhx colon cancer. Greater than 3 paps normal.  No vag dc or irritation.  Anxiety:  She reports that most of her anxiety is surrounding work, taking care of her young son and attending paralegal school. She will graduate in March from her college, at that time she'll be able to transition into hopefully less stressful job. She was tried on buspar and it was not effective for her. She has been taking paxil as prescribed for 4 weeks and feel it is working much better. She feels good on the 20 mg daily dose of paxil and would like to remain on that dose. She is taking the ativan only as PRN and states she has only needed to take a few pills in the 4 weeks.    Past Medical History  Diagnosis Date  . Chicken pox   . Frequent headaches   . Elevated blood pressure reading    Allergies  Allergen Reactions  . Shellfish Allergy Anaphylaxis   Social History   Social History  . Marital Status: Single    Spouse Name: N/A  . Number of Children: 1  . Years of Education: College   Occupational History  . [employer]   Social History Main Topics  . Smoking status: Never Smoker   . Smokeless tobacco: Never Used  . Alcohol Use: Yes     Comment: socially  . Drug Use: No  . Sexual  Activity: No   Other Topics Concern  . Not on file   Social History Narrative   [name] lives with her son in [place]. 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   Review of Systems Negative, with the exception of above mentioned in HPI     Objective:   Physical Exam BP 124/85 mmHg  Pulse 88  Temp(Src) 98.2 F (36.8 C) (Oral)  Resp 20  Wt 157 lb 12 oz (71.555 kg)  SpO2 96%  Body mass index is 28.85 kg/(m^2). Gen: Afebrile. No acute distress. Nontoxic in appearance. Pleasant AAF.  HENT: AT. [place].  MMM.  Abd: Soft.flat. NTND. BS present. No Masses palpated.  Psych: Normal affect, dress and demeanor. Normal speech. Normal thought content and judgment.Marland Kitchen   GYN:  External genitalia within normal limits.  Vaginal mucosa pink, moist, normal rugae.  Nonfriable cervix without lesions, no discharge or bleeding noted on speculum exam.  Bimanual exam revealed normal, nongravid uterus.  No cervical motion tenderness. No adnexal masses bilaterally.       Assessment & Plan:  1. Encounter for routine gynecological examination/cervical cancer screening: - Cytology - PAP; HPV - Prior abnormal PAP with inadequate specimen collection on last PAP by prior PCP. - PAP collected today. - Declined HIV; likely had through  employer.   2. Moderate anxiety - pt doing well on paxil 20 mg. Ativan use is rare. Refills for 6 months on Paxil.  - PARoxetine (PAXIL) 20 MG tablet; Take 1 tablet (20 mg total) by mouth daily.  Dispense: 90 tablet; Refill: 1 - Pt to follow in 6 months or sooner if needed.   F/u 6 months

## 2015-05-26 NOTE — Patient Instructions (Signed)
It was a pleasure seeing you again today. We will call you once your PAP results are available.  I am glad you are doing better with the Paxil. I  Have called in refills, to last 6 months.  Try to monitor BP over the next few months once anxiety is controlled.

## 2015-05-28 ENCOUNTER — Telehealth: Payer: Self-pay | Admitting: Family Medicine

## 2015-05-28 LAB — CYTOLOGY - PAP

## 2015-05-28 NOTE — Telephone Encounter (Signed)
Pt aware of results.  Pt had no questions at this time.

## 2015-05-28 NOTE — Telephone Encounter (Signed)
Please call pt: - Her PAP results are normal. Rpt pap in 3 years.

## 2015-05-28 NOTE — Telephone Encounter (Signed)
Left message for patient to call back to review Pap results.

## 2015-07-23 ENCOUNTER — Encounter: Payer: Self-pay | Admitting: Family Medicine

## 2015-07-23 ENCOUNTER — Ambulatory Visit (INDEPENDENT_AMBULATORY_CARE_PROVIDER_SITE_OTHER): Payer: No Typology Code available for payment source | Admitting: Family Medicine

## 2015-07-23 ENCOUNTER — Ambulatory Visit: Payer: No Typology Code available for payment source | Admitting: Family Medicine

## 2015-07-23 VITALS — BP 120/80 | HR 87 | Temp 98.9°F | Resp 16 | Wt 160.5 lb

## 2015-07-23 DIAGNOSIS — J111 Influenza due to unidentified influenza virus with other respiratory manifestations: Secondary | ICD-10-CM

## 2015-07-23 DIAGNOSIS — R69 Illness, unspecified: Principal | ICD-10-CM

## 2015-07-23 MED ORDER — HYDROCODONE-HOMATROPINE 5-1.5 MG/5ML PO SYRP: ORAL_SOLUTION | ORAL | Status: DC

## 2015-07-23 NOTE — Patient Instructions (Signed)
Trial of mucinex DM or robitussin DM otc as directed on the box. May use OTC nasal saline spray or irrigation solution bid. OTC nonsedating antihistamines prn discussed (do not take any otc meds with decongestant in it)

## 2015-07-23 NOTE — Progress Notes (Signed)
Pre visit review using our clinic review tool, if applicable. No additional management support is needed unless otherwise documented below in the visit note. 

## 2015-07-23 NOTE — Progress Notes (Signed)
OFFICE NOTE  07/23/2015  CC:  Chief Complaint  Patient presents with  . Sore Throat    x 2 days  . Ear Pain    right ear  . Fever   HPI: Patient is a 35 y.o. African-American female who is here for respiratory symptoms. Onset about 2 d/a, ST, HA, R ear pain, stuffy nose, cough, soreness in legs the first day.  Subjective fever on first day but no temp checked.  No n/v/d or rash. Subsequently, muscle soreness has resolved and all the upper resp sx's persist.  Cough is about the same.  No further fevers.  Pertinent PMH:  Past medical, surgical, social, and family history reviewed and no changes are noted since last office visit.  MEDS:  Outpatient Prescriptions Prior to Visit  Medication Sig Dispense Refill  . amLODipine (NORVASC) 10 MG tablet Take 1 tablet (10 mg total) by mouth daily. 30 tablet 6  . cyclobenzaprine (FLEXERIL) 10 MG tablet Take 1 tablet (10 mg total) by mouth 2 (two) times daily as needed for muscle spasms. 60 tablet 2  . LORazepam (ATIVAN) 0.5 MG tablet Take 1 tablet (0.5 mg total) by mouth 2 (two) times daily as needed for anxiety. 30 tablet 1  . PARoxetine (PAXIL) 20 MG tablet Take 1 tablet (20 mg total) by mouth daily. 90 tablet 1   No facility-administered medications prior to visit.    PE: Blood pressure 120/80, pulse 87, temperature 98.9 F (37.2 C), temperature source Oral, resp. rate 16, weight 160 lb 8 oz (72.802 kg), SpO2 97 %. VS: noted--normal. Gen: alert, NAD, NONTOXIC APPEARING. HEENT: eyes without injection, drainage, or swelling.  Ears: EACs clear, TMs with normal light reflex and landmarks.  Nose: Clear rhinorrhea, with some dried, crusty exudate adherent to mildly injected mucosa.  No purulent d/c.  No paranasal sinus TTP.  No facial swelling.  Throat and mouth without focal lesion.  No pharyngial swelling, erythema, or exudate.   Neck: supple, no LAD.   LUNGS: CTA bilat, nonlabored resps.   CV: RRR, no m/r/g. EXT: no c/c/e SKIN: no  rash  IMPRESSION AND PLAN:  Influenza-like illness; I do not think she has influenza. No sign of bacterial infection. Symptomatic care discussed: Trial of mucinex DM or robitussin DM otc as directed on the box. May use OTC nasal saline spray or irrigation solution bid. OTC nonsedating antihistamines prn discussed (do not take any otc meds with decongestant in it) Hycodan syrup 1-2 tsp bid, #142ml.  Therapeutic expectations and side effect profile of medication discussed today.  Patient's questions answered.  An After Visit Summary was printed and given to the patient.  FOLLOW UP: prn

## 2015-08-06 ENCOUNTER — Ambulatory Visit: Payer: No Typology Code available for payment source | Admitting: Family Medicine

## 2015-08-16 ENCOUNTER — Other Ambulatory Visit: Payer: Self-pay | Admitting: *Deleted

## 2015-08-16 MED ORDER — AMLODIPINE BESYLATE 10 MG PO TABS: 10.0000 mg | ORAL_TABLET | Freq: Every day | ORAL | Status: DC

## 2015-08-16 NOTE — Telephone Encounter (Signed)
Amlodipine refilled per refill protocol.

## 2015-11-26 ENCOUNTER — Encounter: Payer: Self-pay | Admitting: Family Medicine

## 2015-12-01 ENCOUNTER — Ambulatory Visit (INDEPENDENT_AMBULATORY_CARE_PROVIDER_SITE_OTHER): Payer: No Typology Code available for payment source | Admitting: Family Medicine

## 2015-12-01 ENCOUNTER — Encounter: Payer: Self-pay | Admitting: Family Medicine

## 2015-12-01 ENCOUNTER — Other Ambulatory Visit: Payer: Self-pay | Admitting: *Deleted

## 2015-12-01 VITALS — BP 114/78 | HR 69 | Temp 98.5°F | Resp 20 | Wt 163.5 lb

## 2015-12-01 DIAGNOSIS — Z3009 Encounter for other general counseling and advice on contraception: Secondary | ICD-10-CM | POA: Insufficient documentation

## 2015-12-01 DIAGNOSIS — Z30011 Encounter for initial prescription of contraceptive pills: Secondary | ICD-10-CM | POA: Diagnosis not present

## 2015-12-01 DIAGNOSIS — F419 Anxiety disorder, unspecified: Secondary | ICD-10-CM

## 2015-12-01 DIAGNOSIS — Z309 Encounter for contraceptive management, unspecified: Secondary | ICD-10-CM

## 2015-12-01 LAB — POCT URINE PREGNANCY: Preg Test, Ur: NEGATIVE

## 2015-12-01 MED ORDER — NORGESTIMATE-ETH ESTRADIOL 0.25-35 MG-MCG PO TABS: 1.0000 | ORAL_TABLET | Freq: Every day | ORAL | Status: DC

## 2015-12-01 MED ORDER — PAROXETINE HCL 20 MG PO TABS: 20.0000 mg | ORAL_TABLET | Freq: Every day | ORAL | Status: DC

## 2015-12-01 NOTE — Progress Notes (Signed)
Patient ID: Kristine [name], female   DOB: 01-Aug-1980, 35 y.o.   MRN: [id number]    Kristine [name] , [birth year], 35 y.o., female MRN: [id number]  CC: birthcontrol counseling Subjective: Pt presents for  OV for counseling on birth control method and initiation. She had been on the depo provera injections in the past, stop using last year secondary to weight gain. Patient had taken the pill in the past and did fine with it. Patient is currently sexually active with a female partner. Her menstrual cycles are "regular" around every 30 days, last 4-5 days, moderate bleeding.  She does not smoke and does not have a history of migraines or blood clots. Her cervical cancer screening is UTD, last collected on 05/26/2015 normal results, with negative HPV co-testing.  Patient's last menstrual period was 11/13/2015.   Results for orders placed or performed in visit on 12/01/15 (from the past 48 hour(s))  POCT urine pregnancy     Status: None   Collection Time: 12/01/15  8:16 AM  Result Value Ref Range   Preg Test, Ur Negative Negative    Allergies  Allergen Reactions  . Shellfish Allergy Anaphylaxis   Social History  Substance Use Topics  . Smoking status: Never Smoker   . Smokeless tobacco: Never Used  . Alcohol Use: Yes     Comment: socially   Past Medical History  Diagnosis Date  . Chicken pox   . Frequent headaches   . Elevated blood pressure reading    Past Surgical History  Procedure Laterality Date  . Cesarean section  2001   Family History  Problem Relation Age of Onset  . Hypertension Mother   . Celiac disease Son      Medication List       This list is accurate as of: 12/01/15  8:09 AM.  Always use your most recent med list.               amLODipine 10 MG tablet  Commonly known as:  NORVASC  Take 1 tablet (10 mg total) by mouth daily.     PARoxetine 20 MG tablet  Commonly known as:  PAXIL  Take 1 tablet (20 mg total) by mouth daily.       ROS: Negative, with the  exception of above mentioned in HPI  Objective:  BP 114/78 mmHg  Pulse 69  Temp(Src) 98.5 F (36.9 C)  Resp 20  Wt 163 lb 8 oz (74.163 kg)  SpO2 97%  LMP 11/13/2015 Body mass index is 29.9 kg/(m^2). Gen: Afebrile. No acute distress. Nontoxic in appearance, well developed, well nourished. AAF, very pleasant.  HENT: AT. [place]. MMM, no oral lesions.  Eyes:Pupils Equal Round Reactive to light, Extraocular movements intact,  Conjunctiva without redness, discharge or icterus. CV: RRR  Chest: CTAB, no wheeze or crackles. Good air movement, normal resp effort.  Abd: Soft.flat. NTND. BS present. No  Masses palpated. No rebound or guarding.  Neuro: Normal gait. PERLA. EOMi. Alert. Oriented x3  Psych: Normal affect, dress and demeanor. Normal speech. Normal thought content and judgment.  Assessment/Plan: Kristine [name] is a 35 y.o. female present for OV for  Encounter for initial prescription of contraceptive pills - POCT urine pregnancy--> negative  Counseling for initiation of birth control method - Start Sprintec - Instructions given on start date, and asking her pharmacist for further information if desired.  - F/U yearly, continue PAP screen in 05/2017, then every 3 years if normal.   electronically signed  by:  Howard Pouch, DO  [place]

## 2015-12-01 NOTE — Telephone Encounter (Signed)
Refill on paxil sent per Dr Raoul Pitch

## 2015-12-01 NOTE — Patient Instructions (Signed)
Oral Contraception Use Oral contraceptive pills (OCPs) are medicines taken to prevent pregnancy. OCPs work by preventing the ovaries from releasing eggs. The hormones in OCPs also cause the cervical mucus to thicken, preventing the sperm from entering the uterus. The hormones also cause the uterine lining to become thin, not allowing a fertilized egg to attach to the inside of the uterus. OCPs are highly effective when taken exactly as prescribed. However, OCPs do not prevent sexually transmitted diseases (STDs). Safe sex practices, such as using condoms along with an OCP, can help prevent STDs. Before taking OCPs, you may have a physical exam and Pap test. Your health care provider may also order blood tests if necessary. Your health care provider will make sure you are a good candidate for oral contraception. Discuss with your health care provider the possible side effects of the OCP you may be prescribed. When starting an OCP, it can take 2 to 3 months for the body to adjust to the changes in hormone levels in your body.  HOW TO TAKE ORAL CONTRACEPTIVE PILLS Your health care provider may advise you on how to start taking the first cycle of OCPs. Otherwise, you can:   Start on day 1 of your menstrual period. You will not need any backup contraceptive protection with this start time.   Start on the first Sunday after your menstrual period or the day you get your prescription. In these cases, you will need to use backup contraceptive protection for the first week.   Start the pill at any time of your cycle. If you take the pill within 5 days of the start of your period, you are protected against pregnancy right away. In this case, you will not need a backup form of birth control. If you start at any other time of your menstrual cycle, you will need to use another form of birth control for 7 days. If your OCP is the type called a minipill, it will protect you from pregnancy after taking it for 2 days (48  hours). After you have started taking OCPs:   If you forget to take 1 pill, take it as soon as you remember. Take the next pill at the regular time.   If you miss 2 or more pills, call your health care provider because different pills have different instructions for missed doses. Use backup birth control until your next menstrual period starts.   If you use a 28-day pack that contains inactive pills and you miss 1 of the last 7 pills (pills with no hormones), it will not matter. Throw away the rest of the non-hormone pills and start a new pill pack.  No matter which day you start the OCP, you will always start a new pack on that same day of the week. Have an extra pack of OCPs and a backup contraceptive method available in case you miss some pills or lose your OCP pack.  HOME CARE INSTRUCTIONS   Do not smoke.   Always use a condom to protect against STDs. OCPs do not protect against STDs.   Use a calendar to mark your menstrual period days.   Read the information and directions that came with your OCP. Talk to your health care provider if you have questions.  SEEK MEDICAL CARE IF:   You develop nausea and vomiting.   You have abnormal vaginal discharge or bleeding.   You develop a rash.   You miss your menstrual period.   You are losing   your hair.   You need treatment for mood swings or depression.   You get dizzy when taking the OCP.   You develop acne from taking the OCP.   You become pregnant.  SEEK IMMEDIATE MEDICAL CARE IF:   You develop chest pain.   You develop shortness of breath.   You have an uncontrolled or severe headache.   You develop numbness or slurred speech.   You develop visual problems.   You develop pain, redness, and swelling in the legs.    This information is not intended to replace advice given to you by your health care provider. Make sure you discuss any questions you have with your health care provider.   Document  Released: 06/22/2011 Document Revised: 07/24/2014 Document Reviewed: 12/22/2012 Elsevier Interactive Patient Education 2016 [employer].  Oral Contraception Information Oral contraceptive pills (OCPs) are medicines taken to prevent pregnancy. OCPs work by preventing the ovaries from releasing eggs. The hormones in OCPs also cause the cervical mucus to thicken, preventing the sperm from entering the uterus. The hormones also cause the uterine lining to become thin, not allowing a fertilized egg to attach to the inside of the uterus. OCPs are highly effective when taken exactly as prescribed. However, OCPs do not prevent sexually transmitted diseases (STDs). Safe sex practices, such as using condoms along with the pill, can help prevent STDs.  Before taking the pill, you may have a physical exam and Pap test. Your health care provider may order blood tests. The health care provider will make sure you are a good candidate for oral contraception. Discuss with your health care provider the possible side effects of the OCP you may be prescribed. When starting an OCP, it can take 2 to 3 months for the body to adjust to the changes in hormone levels in your body.  TYPES OF ORAL CONTRACEPTION  The combination pill--This pill contains estrogen and progestin (synthetic progesterone) hormones. The combination pill comes in 21-day, 28-day, or 91-day packs. Some types of combination pills are meant to be taken continuously (365-day pills). With 21-day packs, you do not take pills for 7 days after the last pill. With 28-day packs, the pill is taken every day. The last 7 pills are without hormones. Certain types of pills have more than 21 hormone-containing pills. With 91-day packs, the first 84 pills contain both hormones, and the last 7 pills contain no hormones or contain estrogen only.  The minipill--This pill contains the progesterone hormone only. The pill is taken every day continuously. It is very important to  take the pill at the same time each day. The minipill comes in packs of 28 pills. All 28 pills contain the hormone.  ADVANTAGES OF ORAL CONTRACEPTIVE PILLS  Decreases premenstrual symptoms.   Treats menstrual period cramps.   Regulates the menstrual cycle.   Decreases a heavy menstrual flow.   May treatacne, depending on the type of pill.   Treats abnormal uterine bleeding.   Treats polycystic ovarian syndrome.   Treats endometriosis.   Can be used as emergency contraception.  THINGS THAT CAN MAKE ORAL CONTRACEPTIVE PILLS LESS EFFECTIVE OCPs can be less effective if:   You forget to take the pill at the same time every day.   You have a stomach or intestinal disease that lessens the absorption of the pill.   You take OCPs with other medicines that make OCPs less effective, such as antibiotics, certain HIV medicines, and some seizure medicines.   You take expired  OCPs.   You forget to restart the pill on day 7, when using the packs of 21 pills.  RISKS ASSOCIATED WITH ORAL CONTRACEPTIVE PILLS  Oral contraceptive pills can sometimes cause side effects, such as:  Headache.  Nausea.  Breast tenderness.  Irregular bleeding or spotting. Combination pills are also associated with a small increased risk of:  Blood clots.  Heart attack.  Stroke.   This information is not intended to replace advice given to you by your health care provider. Make sure you discuss any questions you have with your health care provider.   Document Released: 09/23/2002 Document Revised: 04/23/2013 Document Reviewed: 12/22/2012 Elsevier Interactive Patient Education [employer].

## 2016-02-07 ENCOUNTER — Other Ambulatory Visit: Payer: Self-pay | Admitting: *Deleted

## 2016-02-07 MED ORDER — AMLODIPINE BESYLATE 10 MG PO TABS: 10.0000 mg | ORAL_TABLET | Freq: Every day | ORAL | 0 refills | Status: DC

## 2016-02-07 NOTE — Telephone Encounter (Signed)
RF request for amlodipine LOV: 04/28/15  Next ov: None Last written: 08/16/15 #30 w/ 5RF

## 2016-03-06 ENCOUNTER — Other Ambulatory Visit: Payer: Self-pay | Admitting: *Deleted

## 2016-03-06 MED ORDER — AMLODIPINE BESYLATE 10 MG PO TABS: 10.0000 mg | ORAL_TABLET | Freq: Every day | ORAL | 2 refills | Status: DC

## 2016-03-06 NOTE — Telephone Encounter (Signed)
Amlodipine refilled.

## 2016-05-25 ENCOUNTER — Other Ambulatory Visit: Payer: Self-pay | Admitting: *Deleted

## 2016-05-25 DIAGNOSIS — F419 Anxiety disorder, unspecified: Secondary | ICD-10-CM

## 2016-05-25 MED ORDER — PAROXETINE HCL 20 MG PO TABS: 20.0000 mg | ORAL_TABLET | Freq: Every day | ORAL | 0 refills | Status: DC

## 2016-05-25 MED ORDER — AMLODIPINE BESYLATE 10 MG PO TABS: 10.0000 mg | ORAL_TABLET | Freq: Every day | ORAL | 0 refills | Status: DC

## 2016-08-28 ENCOUNTER — Other Ambulatory Visit: Payer: Self-pay | Admitting: *Deleted

## 2016-08-28 ENCOUNTER — Encounter: Payer: Self-pay | Admitting: *Deleted

## 2016-08-28 DIAGNOSIS — F419 Anxiety disorder, unspecified: Secondary | ICD-10-CM

## 2016-08-28 MED ORDER — PAROXETINE HCL 20 MG PO TABS: 20.0000 mg | ORAL_TABLET | Freq: Every day | ORAL | 0 refills | Status: DC

## 2016-08-28 NOTE — Telephone Encounter (Signed)
30 day supply of paxil sent to pharmacy. Patient needs office visit prior to anymore refills. Message sent to patient in [facility] Chart.

## 2016-09-05 ENCOUNTER — Ambulatory Visit (INDEPENDENT_AMBULATORY_CARE_PROVIDER_SITE_OTHER): Payer: No Typology Code available for payment source | Admitting: Family Medicine

## 2016-09-05 ENCOUNTER — Encounter: Payer: Self-pay | Admitting: Family Medicine

## 2016-09-05 VITALS — BP 117/79 | HR 77 | Temp 98.8°F | Resp 20 | Ht 62.0 in | Wt 164.2 lb

## 2016-09-05 DIAGNOSIS — I10 Essential (primary) hypertension: Secondary | ICD-10-CM

## 2016-09-05 DIAGNOSIS — F419 Anxiety disorder, unspecified: Secondary | ICD-10-CM

## 2016-09-05 LAB — BASIC METABOLIC PANEL WITH GFR
BUN: 8 mg/dL (ref 7–25)
CHLORIDE: 106 mmol/L (ref 98–110)
CO2: 20 mmol/L (ref 20–31)
Calcium: 9.5 mg/dL (ref 8.6–10.2)
Creat: 0.82 mg/dL (ref 0.50–1.10)
GFR, Est African American: >89 mL/min (ref >=60)
GLUCOSE: 89 mg/dL (ref 65–99)
POTASSIUM: 4.6 mmol/L (ref 3.5–5.3)
Sodium: 136 mmol/L (ref 135–146)

## 2016-09-05 MED ORDER — PAROXETINE HCL 20 MG PO TABS: 20.0000 mg | ORAL_TABLET | Freq: Every day | ORAL | 1 refills | Status: DC

## 2016-09-05 MED ORDER — AMLODIPINE BESYLATE 10 MG PO TABS: 10.0000 mg | ORAL_TABLET | Freq: Every day | ORAL | 1 refills | Status: DC

## 2016-09-05 NOTE — Progress Notes (Signed)
Subjective:    Patient ID: Kristine [name], female    DOB: 12/12/1980, 36 y.o.   MRN: [id number]  HPI  Anxiety:  Pt reports she is doing well on paxil 20 mg QD. 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 She is now starting to search for new jobs. She has not needed the ativan much. She does not need refills on ativan today but is needing paxil refills.   Prior note:  She reports that most of her anxiety is surrounding work, taking care of her young son and attending [employer] school. She will graduate in March from her college, at that time she'll be able to transition into hopefully less stressful job. She was tried on buspar and it was not effective for her. She has been taking paxil as prescribed for 4 weeks and feel it is working much better. She feels good on the 20 mg daily dose of paxil and would like to remain on that dose. She is taking the ativan only as PRN and states she has only needed to take a few pills in the 4 weeks.   Hypertension:  Her blood pressure has been well controlled on amlodipine 10 mg QD. She denies any Chest pain, SOB, LE edema or dizziness. Her last BMP 2016 was normal. She does not exercise routinely.   Past Medical History:  Diagnosis Date  . Chicken pox   . Elevated blood pressure reading   . Frequent headaches    Allergies  Allergen Reactions  . Shellfish Allergy Anaphylaxis   Social History   Social History  . Marital status: Single    Spouse name: N/A  . Number of children: 1  . Years of education: College   Occupational History  . [employer]   Social History Main Topics  . Smoking status: Never Smoker  . Smokeless tobacco: Never Used  . Alcohol use Yes     Comment: socially  . Drug use: No  . Sexual activity: No   Other Topics Concern  . Not on file   Social History Narrative   [name] lives with her son in [place]. 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    Review of Systems  Psychiatric/Behavioral: Positive for depression.   Negative, with the exception of above mentioned in HPI     Objective:   Physical Exam BP 117/79 (BP Location: Right Arm, Patient Position: Sitting, Cuff Size: Large)   Pulse 77   Temp 98.8 F (37.1 C)   Resp 20   Ht 5\' 2"  (1.575 m)   Wt 164 lb 4 oz (74.5 kg)   SpO2 99%   BMI 30.04 kg/m   Body mass index is 30.04 kg/m. Gen: Afebrile. No acute distress. Very pleasant AAF HENT: AT. [place]. MMM.  Eyes:Pupils Equal Round Reactive to light, Extraocular movements intact,  Conjunctiva without redness, discharge or icterus. Neck/lymp/endocrine: Supple, no thyromegaly CV: RRR no murmur, no edema, +2/4 P posterior tibialis pulses Chest: CTAB, no wheeze or crackles Abd: Soft. NTND. BS present. Neuro:  Normal gait. PERLA. EOMi. Alert. Oriented.  Psych: Normal affect, dress and demeanor. Normal speech. Normal thought content and judgment..      Assessment & Plan:  Kristine [name] is a 36 y.o. female present for HTN and anxiety follow up. Moderate anxiety - Stable today. Refills on paxil for 6 months.  - pt doing well on paxil 20 mg. Ativan use  is still  Rare and she doe snot need refills.  - PARoxetine (PAXIL) 20 MG tablet; Take 1 tablet (20 mg total) by mouth daily.  Dispense: 90 tablet; Refill: 1 - Pt to follow in 6 months or sooner if needed.   Hypertension:  - stable today. - continue low salt diet, exercise > 150 m a day.  - refills on amlodipine 10 mg QD for 6 months.  - BMP today.   F/u 6 months on chronic medical conditions and yearly for CPE  Electronically Signed by: Howard Pouch, DO [facility] [place]

## 2016-09-05 NOTE — Patient Instructions (Signed)
It was a pleasure seeing you today.  Good luck on your upcoming test! You got this!  Try and exercise > 150 min a week, low sodium diet. Your BP was perfect today.

## 2016-09-06 ENCOUNTER — Telehealth: Payer: Self-pay | Admitting: Family Medicine

## 2016-09-06 NOTE — Telephone Encounter (Signed)
Labs are stable. Please make pt aware

## 2016-09-06 NOTE — Telephone Encounter (Signed)
Spoke with patient reviewed lab results. 

## 2016-10-17 ENCOUNTER — Other Ambulatory Visit: Payer: Self-pay | Admitting: Family Medicine

## 2016-10-17 ENCOUNTER — Encounter: Payer: Self-pay | Admitting: *Deleted

## 2016-10-17 ENCOUNTER — Telehealth: Payer: Self-pay | Admitting: Family Medicine

## 2016-10-17 DIAGNOSIS — Z30011 Encounter for initial prescription of contraceptive pills: Secondary | ICD-10-CM

## 2016-10-17 MED ORDER — NORGESTIMATE-ETH ESTRADIOL 0.25-35 MG-MCG PO TABS: 1.0000 | ORAL_TABLET | Freq: Every day | ORAL | 6 refills | Status: DC

## 2016-10-17 MED ORDER — NORGESTIMATE-ETH ESTRADIOL 0.25-35 MG-MCG PO TABS: 1.0000 | ORAL_TABLET | Freq: Every day | ORAL | 0 refills | Status: DC

## 2016-10-17 NOTE — Addendum Note (Signed)
Addended by: Leota Jacobsen on: 10/17/2016 11:51 AM   Modules accepted: Orders

## 2016-10-17 NOTE — Telephone Encounter (Signed)
**  Remind patient they can make refill requests via MyChart**  Medication refill request (Name & Dosage):  norgestimate-ethinyl estradiol (ORTHO-CYCLEN,SPRINTEC,PREVIFEM) 0.25-35 MG-MCG tablet [[id number]]     Preferred pharmacy (Name & Address): [id number] [place], [place] [phone] (Phone) [phone] (Fax)       Other comments (if applicable):   3 month supply requested

## 2016-10-17 NOTE — Progress Notes (Signed)
Pt is due for CPE in November. I will extend her script until then and we can discuss/document use at that time. This med will be filled every year during her physical as long as PAPs remain UTD.

## 2016-10-18 ENCOUNTER — Encounter: Payer: Self-pay | Admitting: *Deleted

## 2016-11-07 ENCOUNTER — Encounter: Payer: Self-pay | Admitting: Family Medicine

## 2017-02-12 ENCOUNTER — Other Ambulatory Visit: Payer: Self-pay

## 2017-02-12 DIAGNOSIS — F419 Anxiety disorder, unspecified: Secondary | ICD-10-CM

## 2017-02-12 MED ORDER — PAROXETINE HCL 20 MG PO TABS: 20.0000 mg | ORAL_TABLET | Freq: Every day | ORAL | 0 refills | Status: DC

## 2017-02-12 NOTE — Telephone Encounter (Signed)
Medication sent with no refills.

## 2017-03-12 ENCOUNTER — Other Ambulatory Visit: Payer: Self-pay | Admitting: *Deleted

## 2017-03-12 ENCOUNTER — Encounter: Payer: Self-pay | Admitting: *Deleted

## 2017-03-12 MED ORDER — AMLODIPINE BESYLATE 10 MG PO TABS: 10.0000 mg | ORAL_TABLET | Freq: Every day | ORAL | 0 refills | Status: DC

## 2017-03-12 NOTE — Telephone Encounter (Signed)
30 day supply of amlodipine authorized patient needs appt for further refills message sent in My Chart.

## 2017-04-02 ENCOUNTER — Other Ambulatory Visit: Payer: Self-pay | Admitting: *Deleted

## 2017-04-02 DIAGNOSIS — Z30011 Encounter for initial prescription of contraceptive pills: Secondary | ICD-10-CM

## 2017-04-02 MED ORDER — NORGESTIMATE-ETH ESTRADIOL 0.25-35 MG-MCG PO TABS: 1.0000 | ORAL_TABLET | Freq: Every day | ORAL | 2 refills | Status: DC

## 2017-04-13 DIAGNOSIS — Z23 Encounter for immunization: Secondary | ICD-10-CM | POA: Diagnosis not present

## 2017-05-28 ENCOUNTER — Ambulatory Visit (INDEPENDENT_AMBULATORY_CARE_PROVIDER_SITE_OTHER): Payer: BLUE CROSS/BLUE SHIELD | Admitting: Family Medicine

## 2017-05-28 ENCOUNTER — Encounter: Payer: Self-pay | Admitting: Family Medicine

## 2017-05-28 VITALS — BP 132/93 | HR 66 | Temp 98.2°F | Resp 20 | Wt 164.8 lb

## 2017-05-28 DIAGNOSIS — I10 Essential (primary) hypertension: Secondary | ICD-10-CM

## 2017-05-28 DIAGNOSIS — E669 Obesity, unspecified: Secondary | ICD-10-CM

## 2017-05-28 DIAGNOSIS — F419 Anxiety disorder, unspecified: Secondary | ICD-10-CM | POA: Diagnosis not present

## 2017-05-28 MED ORDER — PAROXETINE HCL 20 MG PO TABS: 10.0000 mg | ORAL_TABLET | Freq: Every day | ORAL | 0 refills | Status: DC

## 2017-05-28 MED ORDER — AMLODIPINE BESYLATE 10 MG PO TABS: 10.0000 mg | ORAL_TABLET | Freq: Every day | ORAL | 1 refills | Status: DC

## 2017-05-28 NOTE — Progress Notes (Signed)
Kristine [name] , Dec 10, 1980, 36 y.o., female MRN: [id number] Patient Care Team    Relationship Specialty Notifications Start End  Ma Hillock, DO PCP - General Family Medicine  04/28/15    Comment: Patient request female provider    Chief Complaint  Patient presents with  . Hypertension     Subjective:   Anxiety:   She reports she is doing much better with her anxiety now that she has a new job. 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 She would like to try to decrease her paxil dose. Seh currently takes paxil 20 mg Qd.  Prior note:  Pt reports she is doing well on paxil 20 mg QD. 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 She is now starting to search for new jobs. She has not needed the ativan much. She does not need refills on ativan today but is needing paxil refills.   Prior note:  She reports that most of her anxiety is surrounding work, taking care of her young son and attending [employer] school. She will graduate in March from her college, at that time she'll be able to transition into hopefully less stressful job. She was tried on buspar and it was not effective for her. She has been taking paxil as prescribed for 4 weeks and feel it is working much better. She feels good on the 20 mg daily dose of paxil and would like to remain on that dose. She is taking the ativan only as PRN and states she has only needed to take a few pills in the 4 weeks.   Hypertension/obesity:  Pt reports compliance with amlodipine 10 mg (she has been out for 2 days). Blood pressures ranges at home WNL. Patient denies chest pain, shortness of breath or lower extremity edema.  BMP: 09/05/2016 WNL Diet: not routinely.  RF: HTN, obesity  Depression screen [facility] 2/9 05/28/2017  Decreased Interest 0  Down, Depressed, Hopeless 0  PHQ - 2 Score 0    Allergies  Allergen Reactions  . Shellfish Allergy Anaphylaxis   Social History   Tobacco Use  . Smoking  status: Never Smoker  . Smokeless tobacco: Never Used  Substance Use Topics  . Alcohol use: Yes    Comment: socially   Past Medical History:  Diagnosis Date  . Chicken pox   . Elevated blood pressure reading   . Frequent headaches    Past Surgical History:  Procedure Laterality Date  . CESAREAN SECTION  2001   Family History  Problem Relation Age of Onset  . Hypertension Mother   . Celiac disease Son    Allergies as of 05/28/2017      Reactions   Shellfish Allergy Anaphylaxis      Medication List        Accurate as of 05/28/17 10:29 AM. Always use your most recent med list.          amLODipine 10 MG tablet Commonly known as:  NORVASC Take 1 tablet (10 mg total) by mouth daily.   norgestimate-ethinyl estradiol 0.25-35 MG-MCG tablet Commonly known as:  ORTHO-CYCLEN,SPRINTEC,PREVIFEM Take 1 tablet by mouth daily. Needs appt for additional refills.   PARoxetine 20 MG tablet Commonly known as:  PAXIL Take 1 tablet (20 mg total) by mouth daily.       All past medical history, surgical history, allergies, family history, immunizations andmedications were updated in the EMR today and reviewed under the history and  medication portions of their EMR.     ROS: Negative, with the exception of above mentioned in HPI   Objective:  BP (!) 132/93 (BP Location: Left Arm, Patient Position: Sitting, Cuff Size: Large)   Pulse 66   Temp 98.2 F (36.8 C)   Resp 20   Wt 164 lb 12 oz (74.7 kg)   SpO2 97%   BMI 30.13 kg/m  Body mass index is 30.13 kg/m. Gen: Afebrile. No acute distress. Nontoxic in appearance, well developed, well nourished.  HENT: AT. [place].MMM, no oral lesions.  Eyes:Pupils Equal Round Reactive to light, Extraocular movements intact,  Conjunctiva without redness, discharge or icterus. Neck/lymp/endocrine: Supple,no lymphadenopathy CV: RRR no murmur, no edema Chest: CTAB, no wheeze or crackles. Good air movement, normal resp effort.  Neuro:  Normal gait.  PERLA. EOMi. Alert. Oriented x3 Psych: Normal affect, dress and demeanor. Normal speech. Normal thought content and judgment.  No exam data present No results found. No results found for this or any previous visit (from the past 24 hour(s)).  Assessment/Plan: Kristine [name] is a 36 y.o. female present for OV for  Moderate anxiety - discussed 1/2 paxil for 3 weeks, then QOD for a week if desiring to DC.  - if she changes her mind, she can continue and call in, will refill for 6 months at her dose. - PARoxetine (PAXIL) 20 MG tablet; Take 0.5-1 tablets (10-20 mg total) daily by mouth.  Dispense: 30 tablet; Refill: 0  Essential hypertension/obesity: - stable (did not take med today).  - low sodium. Exercise.  - refills on amlodipine 10 mg Qd for 6 months.  - f/u 6 months.    Reviewed expectations re: course of current medical issues.  Discussed self-management of symptoms.  Outlined signs and symptoms indicating need for more acute intervention.  Patient verbalized understanding and all questions were answered.  Patient received an After-Visit Summary.    No orders of the defined types were placed in this encounter.    Note is dictated utilizing voice recognition software. Although note has been proof read prior to signing, occasional typographical errors still can be missed. If any questions arise, please do not hesitate to call for verification.   electronically signed by:  Howard Pouch, DO  [employer]

## 2017-05-28 NOTE — Patient Instructions (Addendum)
Paxil 1/2 tab daily for 3-4 weeks, then if still desiring to stop medication take 1/2 tab every other day for 1 week, then stop.  If you decide you want to stay on medication, call in to let us know what dose and we will call in medication for you to last 6 months.   Allergy med daily : Claritin or zyrtec are good.  Mucinex DM or Coricidin is good for your symptoms      Hypertension Hypertension is another name for high blood pressure. High blood pressure forces your heart to work harder to pump blood. This can cause problems over time. There are two numbers in a blood pressure reading. There is a top number (systolic) over a bottom number (diastolic). It is best to have a blood pressure below 120/80. Healthy choices can help lower your blood pressure. You may need medicine to help lower your blood pressure if:  Your blood pressure cannot be lowered with healthy choices.  Your blood pressure is higher than 130/80.  Follow these instructions at home: Eating and drinking  If directed, follow the DASH eating plan. This diet includes: ? Filling half of your plate at each meal with fruits and vegetables. ? Filling one quarter of your plate at each meal with whole grains. Whole grains include whole wheat pasta, brown rice, and whole grain bread. ? Eating or drinking low-fat dairy products, such as skim milk or low-fat yogurt. ? Filling one quarter of your plate at each meal with low-fat (lean) proteins. Low-fat proteins include fish, skinless chicken, eggs, beans, and tofu. ? Avoiding fatty meat, cured and processed meat, or chicken with skin. ? Avoiding premade or processed food.  Eat less than 1,500 mg of salt (sodium) a day.  Limit alcohol use to no more than 1 drink a day for nonpregnant women and 2 drinks a day for men. One drink equals 12 oz of beer, 5 oz of wine, or 1 oz of hard liquor. Lifestyle  Work with your doctor to stay at a healthy weight or to lose weight. Ask your doctor  what the best weight is for you.  Get at least 30 minutes of exercise that causes your heart to beat faster (aerobic exercise) most days of the week. This may include walking, swimming, or biking.  Get at least 30 minutes of exercise that strengthens your muscles (resistance exercise) at least 3 days a week. This may include lifting weights or pilates.  Do not use any products that contain nicotine or tobacco. This includes cigarettes and e-cigarettes. If you need help quitting, ask your doctor.  Check your blood pressure at home as told by your doctor.  Keep all follow-up visits as told by your doctor. This is important. Medicines  Take over-the-counter and prescription medicines only as told by your doctor. Follow directions carefully.  Do not skip doses of blood pressure medicine. The medicine does not work as well if you skip doses. Skipping doses also puts you at risk for problems.  Ask your doctor about side effects or reactions to medicines that you should watch for. Contact a doctor if:  You think you are having a reaction to the medicine you are taking.  You have headaches that keep coming back (recurring).  You feel dizzy.  You have swelling in your ankles.  You have trouble with your vision. Get help right away if:  You get a very bad headache.  You start to feel confused.  You feel weak or  numb.  You feel faint.  You get very bad pain in your: ? Chest. ? Belly (abdomen).  You throw up (vomit) more than once.  You have trouble breathing. Summary  Hypertension is another name for high blood pressure.  Making healthy choices can help lower blood pressure. If your blood pressure cannot be controlled with healthy choices, you may need to take medicine. This information is not intended to replace advice given to you by your health care provider. Make sure you discuss any questions you have with your health care provider. Document Released: 12/20/2007 Document  Revised: 05/31/2016 Document Reviewed: 05/31/2016 Elsevier Interactive Patient Education  [employer].

## 2017-06-04 ENCOUNTER — Telehealth: Payer: Self-pay | Admitting: Family Medicine

## 2017-06-04 ENCOUNTER — Encounter: Payer: Self-pay | Admitting: Family Medicine

## 2017-06-04 ENCOUNTER — Encounter: Payer: Self-pay | Admitting: *Deleted

## 2017-06-04 NOTE — Telephone Encounter (Signed)
Headaches could potentially be a side effect of weaning off paxil. Although she was on very low dose and told to take 1/2 dose for a few weeks, then QOD for a week.  Uncertain time length if related to paxil, all pts are different.  Tylenol is fine to take.

## 2017-06-04 NOTE — Telephone Encounter (Signed)
Called and left message for patient information sent in [facility] Chart

## 2017-06-14 DIAGNOSIS — Z713 Dietary counseling and surveillance: Secondary | ICD-10-CM | POA: Diagnosis not present

## 2017-07-11 ENCOUNTER — Encounter: Payer: Self-pay | Admitting: Family Medicine

## 2017-10-09 DIAGNOSIS — Z713 Dietary counseling and surveillance: Secondary | ICD-10-CM | POA: Diagnosis not present

## 2017-10-15 DIAGNOSIS — R509 Fever, unspecified: Secondary | ICD-10-CM | POA: Diagnosis not present

## 2017-10-15 DIAGNOSIS — J111 Influenza due to unidentified influenza virus with other respiratory manifestations: Secondary | ICD-10-CM | POA: Diagnosis not present

## 2017-11-26 ENCOUNTER — Encounter: Payer: Self-pay | Admitting: Family Medicine

## 2017-11-26 ENCOUNTER — Encounter: Payer: Self-pay | Admitting: *Deleted

## 2017-11-26 ENCOUNTER — Other Ambulatory Visit: Payer: Self-pay | Admitting: *Deleted

## 2017-11-26 MED ORDER — AMLODIPINE BESYLATE 10 MG PO TABS: 10.0000 mg | ORAL_TABLET | Freq: Every day | ORAL | 0 refills | Status: DC

## 2017-11-26 NOTE — Telephone Encounter (Signed)
Amlodipine refilled for 30 day supply. Patient needs office visit prior to anymore refills. Sent patient message in [facility] Chart.

## 2017-12-14 ENCOUNTER — Encounter: Payer: Self-pay | Admitting: Family Medicine

## 2017-12-14 ENCOUNTER — Ambulatory Visit (INDEPENDENT_AMBULATORY_CARE_PROVIDER_SITE_OTHER): Payer: BLUE CROSS/BLUE SHIELD | Admitting: Family Medicine

## 2017-12-14 VITALS — BP 104/72 | HR 70 | Temp 98.0°F | Resp 20 | Ht 62.0 in | Wt 167.2 lb

## 2017-12-14 DIAGNOSIS — E559 Vitamin D deficiency, unspecified: Secondary | ICD-10-CM | POA: Diagnosis not present

## 2017-12-14 DIAGNOSIS — E785 Hyperlipidemia, unspecified: Secondary | ICD-10-CM | POA: Diagnosis not present

## 2017-12-14 DIAGNOSIS — I10 Essential (primary) hypertension: Secondary | ICD-10-CM | POA: Diagnosis not present

## 2017-12-14 LAB — COMPREHENSIVE METABOLIC PANEL
ALBUMIN: 4.4 g/dL (ref 3.5–5.2)
ALK PHOS: 74 U/L (ref 39–117)
ALT: 15 U/L (ref 0–35)
AST: 16 U/L (ref 0–37)
BILIRUBIN TOTAL: 0.3 mg/dL (ref 0.2–1.2)
BUN: 12 mg/dL (ref 6–23)
CO2: 24 mEq/L (ref 19–32)
Calcium: 9.7 mg/dL (ref 8.4–10.5)
Chloride: 105 mEq/L (ref 96–112)
Creatinine, Ser: 0.68 mg/dL (ref 0.40–1.20)
GFR: 125.73 mL/min (ref >60.00)
GLUCOSE: 92 mg/dL (ref 70–99)
Potassium: 4.5 mEq/L (ref 3.5–5.1)
SODIUM: 137 meq/L (ref 135–145)
TOTAL PROTEIN: 8 g/dL (ref 6.0–8.3)

## 2017-12-14 LAB — LIPID PANEL
CHOLESTEROL: 239 mg/dL — AB (ref 0–200)
HDL: 51.3 mg/dL (ref >39.00)
LDL Cholesterol: 171 mg/dL — ABNORMAL HIGH (ref 0–99)
NONHDL: 187.33
Total CHOL/HDL Ratio: 5
Triglycerides: 84 mg/dL (ref 0.0–149.0)
VLDL: 16.8 mg/dL (ref 0.0–40.0)

## 2017-12-14 LAB — CBC WITH DIFFERENTIAL/PLATELET
BASOS PCT: 1.4 % (ref 0.0–3.0)
Basophils Absolute: 0.1 10*3/uL (ref 0.0–0.1)
EOS ABS: 0.1 10*3/uL (ref 0.0–0.7)
EOS PCT: 1.4 % (ref 0.0–5.0)
HCT: 37.3 % (ref 36.0–46.0)
Hemoglobin: 12.4 g/dL (ref 12.0–15.0)
Lymphocytes Relative: 29.9 % (ref 12.0–46.0)
Lymphs Abs: 1.8 10*3/uL (ref 0.7–4.0)
MCHC: 33.3 g/dL (ref 30.0–36.0)
MCV: 78.2 fl (ref 78.0–100.0)
MONO ABS: 0.5 10*3/uL (ref 0.1–1.0)
Monocytes Relative: 8.2 % (ref 3.0–12.0)
NEUTROS PCT: 59.1 % (ref 43.0–77.0)
Neutro Abs: 3.6 10*3/uL (ref 1.4–7.7)
PLATELETS: 452 10*3/uL — AB (ref 150.0–400.0)
RBC: 4.76 Mil/uL (ref 3.87–5.11)
RDW: 14.5 % (ref 11.5–15.5)
WBC: 6.1 10*3/uL (ref 4.0–10.5)

## 2017-12-14 LAB — TSH: TSH: 1.05 u[IU]/mL (ref 0.35–4.50)

## 2017-12-14 MED ORDER — AMLODIPINE BESYLATE 10 MG PO TABS: 10.0000 mg | ORAL_TABLET | Freq: Every day | ORAL | 1 refills | Status: DC

## 2017-12-14 NOTE — Patient Instructions (Signed)
I have refilled your meds to day.  Follow up in 6 months on hypertension.  We will call you with lab results.   It was nice to see you today!  Please help [place] help you:  We are honored you have chosen [facility] for your Primary Care home. Below you will find basic instructions that you may need to access in the future. Please help [place] help you by reading the instructions, which cover many of the frequent questions we experience.   Prescription refills and request:  -In order to allow more efficient response time, please call your pharmacy for all refills. They will forward the request electronically to [place]. This allows for the quickest possible response. Request left on a nurse line can take longer to refill, since these are checked as time allows between office patients and other phone calls.  - refill request can take up to 3-5 working days to complete.  - If request is sent electronically and request is appropiate, it is usually completed in 1-2 business days.  - all patients will need to be seen routinely for all chronic medical conditions requiring prescription medications (see follow-up below). If you are overdue for follow up on your condition, you will be asked to make an appointment and we will call in enough medication to cover you until your appointment (up to 30 days).  - all controlled substances will require a face to face visit to request/refill.  - if you desire your prescriptions to go through a new pharmacy, and have an active script at original pharmacy, you will need to call your pharmacy and have scripts transferred to new pharmacy. This is completed between the pharmacy locations and not by your provider.    Results: If any images or labs were ordered, it can take up to 1 week to get results depending on the test ordered and the lab/facility running and resulting the test. - Normal or stable results, which do not need further discussion, may be released to your mychart  immediately with attached note to you. A call may not be generated for normal results. Please make certain to sign up for mychart. If you have questions on how to activate your mychart you can call the front office.  - If your results need further discussion, our office will attempt to contact you via phone, and if unable to reach you after 2 attempts, we will release your abnormal result to your mychart with instructions.  - All results will be automatically released in mychart after 1 week.  - Your provider will provide you with explanation and instruction on all relevant material in your results. Please keep in mind, results and labs may appear confusing or abnormal to the untrained eye, but it does not mean they are actually abnormal for you personally. If you have any questions about your results that are not covered, or you desire more detailed explanation than what was provided, you should make an appointment with your provider to do so.   Our office handles many outgoing and incoming calls daily. If we have not contacted you within 1 week about your results, please check your mychart to see if there is a message first and if not, then contact our office.  In helping with this matter, you help decrease call volume, and therefore allow [place] to be able to respond to patients needs more efficiently.   Acute office visits (sick visit):  An acute visit is intended for a new  problem and are scheduled in shorter time slots to allow schedule openings for patients with new problems. This is the appropriate visit to discuss a new problem. Problems will not be addressed by phone call or Echart message. Appointment is needed if requesting treatment. In order to provide you with excellent quality medical care with proper time for you to explain your problem, have an exam and receive treatment with instructions, these appointments should be limited to one new problem per visit. If you experience a new problem, in  which you desire to be addressed, please make an acute office visit, we save openings on the schedule to accommodate you. Please do not save your new problem for any other type of visit, let us take care of it properly and quickly for you.   Follow up visits:  Depending on your condition(s) your provider will need to see you routinely in order to provide you with quality care and prescribe medication(s). Most chronic conditions (Example: hypertension, Diabetes, depression/anxiety... etc), require visits a couple times a year. Your provider will instruct you on proper follow up for your personal medical conditions and history. Please make certain to make follow up appointments for your condition as instructed. Failing to do so could result in lapse in your medication treatment/refills. If you request a refill, and are overdue to be seen on a condition, we will always provide you with a 30 day script (once) to allow you time to schedule.    Medicare wellness (well visit): - we have a wonderful Nurse Maudie Mercury), that will meet with you and provide you will yearly medicare wellness visits. These visits should occur yearly (can not be scheduled less than 1 calendar year apart) and cover preventive health, immunizations, advance directives and screenings you are entitled to yearly through your medicare benefits. Do not miss out on your entitled benefits, this is when medicare will pay for these benefits to be ordered for you.  These are strongly encouraged by your provider and is the appropriate type of visit to make certain you are up to date with all preventive health benefits. If you have not had your medicare wellness exam in the last 12 months, please make certain to schedule one by calling the office and schedule your medicare wellness with Maudie Mercury as soon as possible.   Yearly physical (well visit):  - Adults are recommended to be seen yearly for physicals. Check with your insurance and date of your last physical,  most insurances require one calendar year between physicals. Physicals include all preventive health topics, screenings, medical exam and labs that are appropriate for gender/age and history. You may have fasting labs needed at this visit. This is a well visit (not a sick visit), new problems should not be covered during this visit (see acute visit).  - Pediatric patients are seen more frequently when they are younger. Your provider will advise you on well child visit timing that is appropriate for your their age. - This is not a medicare wellness visit. Medicare wellness exams do not have an exam portion to the visit. Some medicare companies allow for a physical, some do not allow a yearly physical. If your medicare allows a yearly physical you can schedule the medicare wellness with our nurse Maudie Mercury and have your physical with your provider after, on the same day. Please check with insurance for your full benefits.   Late Policy/No Shows:  - all new patients should arrive 15-30 minutes earlier than appointment to allow [place]  time  to  obtain all personal demographics,  insurance information and for you to complete office paperwork. - All established patients should arrive 10-15 minutes earlier than appointment time to update all information and be checked in .  - In our best efforts to run on time, if you are late for your appointment you will be asked to either reschedule or if able, we will work you back into the schedule. There will be a wait time to work you back in the schedule,  depending on availability.  - If you are unable to make it to your appointment as scheduled, please call 24 hours ahead of time to allow [place] to fill the time slot with someone else who needs to be seen. If you do not cancel your appointment ahead of time, you may be charged a no show fee.

## 2017-12-14 NOTE — Progress Notes (Signed)
Kristine [name] , 04-12-1981, 37 y.o., female MRN: [id number] Patient Care Team    Relationship Specialty Notifications Start End  Ma Hillock, DO PCP - General Family Medicine  04/28/15    Comment: Patient request female provider    Chief Complaint  Patient presents with  . Hypertension     Subjective:   Vit D deficiency: Very low in 2015, needed supplement. Never rechecked and not taking added supp.   Hypertension/obesity/HLD:  Pt reports compliance with amlodipine 10 mg. Blood pressures ranges at home normal. Patient denies chest pain, shortness of breath or lower extremity edema.  BMP: 09/05/2016 WNL Lipids: elevated last check 2015 (see below) Diet: not routinely.  RF: HTN, HLD, obesity  Lipid Panel     Component Value Date/Time   CHOL 256 (H) 07/21/2013 0902   TRIG 73.0 07/21/2013 0902   HDL 55.90 07/21/2013 0902   CHOLHDL 5 07/21/2013 0902   VLDL 14.6 07/21/2013 0902   LDLDIRECT 188.2 07/21/2013 0902    BMP Latest Ref Rng & Units 09/05/2016 01/22/2015 07/21/2013  Glucose 65 - 99 mg/dL 89 90 86  BUN 7 - 25 mg/dL 8 11 12   Creatinine 0.50 - 1.10 mg/dL 0.82 0.80 0.7  Sodium 135 - 146 mmol/L 136 135 135  Potassium 3.5 - 5.3 mmol/L 4.6 3.8 4.4  Chloride 98 - 110 mmol/L 106 100 103  CO2 20 - 31 mmol/L 20 24 25   Calcium 8.6 - 10.2 mg/dL 9.5 9.9 9.6    Depression screen [facility] 2/9 12/14/2017 05/28/2017  Decreased Interest 0 0  Down, Depressed, Hopeless 0 0  PHQ - 2 Score 0 0  Altered sleeping 0 -  Tired, decreased energy 0 -  Change in appetite 0 -  Feeling bad or failure about yourself  0 -  Trouble concentrating 0 -  Moving slowly or fidgety/restless 0 -  Suicidal thoughts 0 -  PHQ-9 Score 0 -    Allergies  Allergen Reactions  . Shellfish Allergy Anaphylaxis   Social History   Tobacco Use  . Smoking status: Never Smoker  . Smokeless tobacco: Never Used  Substance Use Topics  . Alcohol use: Yes    Comment: socially   Past Medical History:    Diagnosis Date  . Chicken pox   . Elevated blood pressure reading   . Frequent headaches    Past Surgical History:  Procedure Laterality Date  . CESAREAN SECTION  2001   Family History  Problem Relation Age of Onset  . Hypertension Mother   . Celiac disease Son    Allergies as of 12/14/2017      Reactions   Shellfish Allergy Anaphylaxis      Medication List        Accurate as of 12/14/17  9:11 AM. Always use your most recent med list.          amLODipine 10 MG tablet Commonly known as:  NORVASC Take 1 tablet (10 mg total) by mouth daily. Needs office visit prior to anymore refills       All past medical history, surgical history, allergies, family history, immunizations andmedications were updated in the EMR today and reviewed under the history and medication portions of their EMR.     ROS: Negative, with the exception of above mentioned in HPI   Objective:  BP 104/72 (BP Location: Left Arm, Patient Position: Sitting, Cuff Size: Large)   Pulse 70   Temp 98 F (36.7 C)   Resp 20  Ht 5\' 2"  (1.575 m)   Wt 167 lb 4 oz (75.9 kg)   LMP 12/04/2017   SpO2 100%   BMI 30.59 kg/m  Body mass index is 30.59 kg/m. Gen: Afebrile. No acute distress. Nontoxic. Very pleasant. Obese AAF.  HENT: AT. [place]. MMM.  Eyes:Pupils Equal Round Reactive to light, Extraocular movements intact,  Conjunctiva without redness, discharge or icterus. Neck/lymp/endocrine: Supple,no lymphadenopathy, no thyromegaly CV: RRR no murmur, no edema, +2/4 P posterior tibialis pulses Chest: CTAB, no wheeze or crackles Abd: Soft. NTND. BS present.  Skin: no rashes, purpura or petechiae.  Neuro:  Normal gait. PERLA. EOMi. Alert. Oriented x3    No exam data present No results found. No results found for this or any previous visit (from the past 24 hour(s)).  Assessment/Plan: Kristine [name] is a 37 y.o. female present for OV for  Essential hypertension/obesity/HLD: - great. Stable.  - labs  overdue: cbc, cmp, tsh, lipid collected today, she is fasting.  - low sodium. Exercise.  - refills on amlodipine 10 mg Qd for 6 months.  - f/u 6 months.   Vitamin D deficiency Taking OTC in [facility] only.  Recheck today   Reviewed expectations re: course of current medical issues.  Discussed self-management of symptoms.  Outlined signs and symptoms indicating need for more acute intervention.  Patient verbalized understanding and all questions were answered.  Patient received an After-Visit Summary.   > 25 minutes spent with patient, >50% of time spent face to face counseling    No orders of the defined types were placed in this encounter.    Note is dictated utilizing voice recognition software. Although note has been proof read prior to signing, occasional typographical errors still can be missed. If any questions arise, please do not hesitate to call for verification.   electronically signed by:  Howard Pouch, DO  [place]

## 2017-12-17 ENCOUNTER — Telehealth: Payer: Self-pay | Admitting: Family Medicine

## 2017-12-17 DIAGNOSIS — E785 Hyperlipidemia, unspecified: Secondary | ICD-10-CM

## 2017-12-17 DIAGNOSIS — E559 Vitamin D deficiency, unspecified: Secondary | ICD-10-CM

## 2017-12-17 LAB — VITAMIN D 25 HYDROXY (VIT D DEFICIENCY, FRACTURES): VITD: 20.24 ng/mL — ABNORMAL LOW (ref 30.00–100.00)

## 2017-12-17 MED ORDER — PRAVASTATIN SODIUM 40 MG PO TABS: 40.0000 mg | ORAL_TABLET | Freq: Every day | ORAL | 3 refills | Status: DC

## 2017-12-17 MED ORDER — VITAMIN D (ERGOCALCIFEROL) 1.25 MG (50000 UNIT) PO CAPS: 50000.0000 [IU] | ORAL_CAPSULE | ORAL | 0 refills | Status: DC

## 2017-12-17 NOTE — Telephone Encounter (Signed)
Called patient left message for patient to return call to review lab results and instructions.

## 2017-12-17 NOTE — Telephone Encounter (Signed)
Please inform patient the following information: Her labs all look good, except her cholesterol which is rather high and her Vit D is rather low.   - a statin is recommended with cholesterol this high, for cardiovascular protection. However, if she decides to have another baby, she can not take a statin during pregnancy. Exercise > 150 min a week and a diet low in saturated fats can be helpful. Sometimes it is genetic, and it appears she had elevated panel in 2015 as well.  - I have also called in a vit d supplement once a week. She should take OTC 1000u daily with a meal as well (indefinetly) .   F/u 3 months with provider apt and repeat labs-fasting after starting above meds.

## 2017-12-17 NOTE — Telephone Encounter (Signed)
Spoke with patient reviewed lab results and instructions. Patient verbalized understanding. 

## 2018-06-19 ENCOUNTER — Ambulatory Visit: Payer: BLUE CROSS/BLUE SHIELD | Admitting: Family Medicine

## 2018-09-12 ENCOUNTER — Other Ambulatory Visit: Payer: Self-pay | Admitting: Family Medicine

## 2018-09-12 NOTE — Telephone Encounter (Signed)
Tried to call patient on cell phone and VM was full so I was unable to leave message. My Chart message was sent to patient with information

## 2018-09-12 NOTE — Telephone Encounter (Signed)
Please inform patient the following information: Received refill request for amlodipine- pt has not been seen since 11/2018. She is overdue for 6 month routine follow up on her HTN.  Please schedule her for an appt- and we can extend a 30 d script to her.

## 2018-10-01 ENCOUNTER — Encounter: Payer: Self-pay | Admitting: Family Medicine

## 2018-10-01 NOTE — Telephone Encounter (Signed)
Pt is self-pay, please let me know how much a f/u ov will cost pt. Thanks.

## 2018-10-02 MED ORDER — AMLODIPINE BESYLATE 10 MG PO TABS: 10.0000 mg | ORAL_TABLET | Freq: Every day | ORAL | 0 refills | Status: DC

## 2018-10-18 ENCOUNTER — Ambulatory Visit (INDEPENDENT_AMBULATORY_CARE_PROVIDER_SITE_OTHER): Payer: Self-pay | Admitting: Family Medicine

## 2018-10-18 ENCOUNTER — Other Ambulatory Visit: Payer: Self-pay

## 2018-10-18 ENCOUNTER — Encounter: Payer: Self-pay | Admitting: Family Medicine

## 2018-10-18 VITALS — Ht 62.0 in | Wt 159.2 lb

## 2018-10-18 DIAGNOSIS — I10 Essential (primary) hypertension: Secondary | ICD-10-CM

## 2018-10-18 DIAGNOSIS — E559 Vitamin D deficiency, unspecified: Secondary | ICD-10-CM

## 2018-10-18 DIAGNOSIS — E663 Overweight: Secondary | ICD-10-CM | POA: Insufficient documentation

## 2018-10-18 DIAGNOSIS — E785 Hyperlipidemia, unspecified: Secondary | ICD-10-CM

## 2018-10-18 MED ORDER — AMLODIPINE BESYLATE 10 MG PO TABS: 10.0000 mg | ORAL_TABLET | Freq: Every day | ORAL | 1 refills | Status: DC

## 2018-10-18 NOTE — Progress Notes (Signed)
Virtual Visit via Video   I connected with Kristine [name] on 10/18/18 at  8:30 AM EDT by a video enabled telemedicine application and verified that I am speaking with the correct person using two identifiers. Location patient: Home Location provider: [facility], Office Persons participating in the virtual visit: Patient, Dr. Raoul Pitch and R.Baker, LPN  I discussed the limitations of evaluation and management by telemedicine and the availability of in person appointments. The patient expressed understanding and agreed to proceed.  Subjective:   Chief Complaint  Patient presents with  . Hypertension    Pt does not have home BP machine. Pt was without insurance for 4 months, restarted in December.    Pt lost insurance in august 2019.  Employer does not provide health benefits, despite her working full-time.  HPI: Vit D deficiency: 12/14/2017 vit d 20-- > prescribed high dose for 12 weeks. She a multivitamin only.   Hypertension/overweight/HLD:  Pt reports  Compliance with amlodipine 10 mg. Blood pressures ranges at home normal.Patient denies chest pain, shortness of breath, dizziness or lower extremity edema.  BMP: 11/2017 WNL CBC: " WNL Lipids: 11/2017 total cholesterol 239, HDL 51, LDL 171, triglycerides 84 TSH: 12/14/2017 WNL Diet: not routinely.  RF: HTN, HLD, obesity ROS: See pertinent positives and negatives per HPI.  Patient Active Problem List   Diagnosis Date Noted  . Hyperlipidemia LDL goal <130 12/14/2017  . Morbid obesity ([place]) 05/28/2017  . Vitamin D deficiency 01/29/2014  . Hypertension 07/21/2013    Social History   Tobacco Use  . Smoking status: Never Smoker  . Smokeless tobacco: Never Used  Substance Use Topics  . Alcohol use: Yes    Comment: socially    Current Outpatient Medications:  .  amLODipine (NORVASC) 10 MG tablet, Take 1 tablet (10 mg total) by mouth daily. Needs office visit prior to anymore refills, Disp: 30 tablet, Rfl: 0 .  Multiple  Vitamin (MULTI-VITAMIN DAILY PO), Take 1 mg by mouth., Disp: , Rfl:  .  pravastatin (PRAVACHOL) 40 MG tablet, Take 1 tablet (40 mg total) by mouth daily. (Patient not taking: Reported on 10/18/2018), Disp: 90 tablet, Rfl: 3 .  Vitamin D, Ergocalciferol, (DRISDOL) 50000 units CAPS capsule, Take 1 capsule (50,000 Units total) by mouth every 7 (seven) days. (Patient not taking: Reported on 10/18/2018), Disp: 12 capsule, Rfl: 0  Allergies  Allergen Reactions  . Shellfish Allergy Anaphylaxis    Objective:  Ht 5\' 2"  (1.575 m)   Wt 159 lb 4 oz (72.2 kg)   LMP 10/04/2018   BMI 29.13 kg/m  128/81- last check at her mothers.  Gen: No acute distress. Nontoxic in appearance.  Pleasant, African-American female. HENT: AT. [place].  MMM.  Eyes: Conjunctiva without redness, discharge or icterus. CV: No edema Chest: Cough and shortness of breath not present Neuro:  Alert. Oriented x3  Psych: Normal affect, dress and demeanor. Normal speech. Normal thought content and judgment.   Assessment and Plan:  Kristine [name] is a 38 y.o. AAF Vitamin D deficiency - Pt guided to supplement Vit D OTC 1000u-2000u a day.   Essential hypertension/Hyperlipidemia LDL goal <130/overweight - stable by report. Continue amlodipine 10 mg QD. Refills provided today for 6 months. Discussed Good RX and pharmacy discount programs. She wil look into them. She was told how to transfer scripts between pharmacies if needed.  - currently not taking statin because of lack of insurance coverage.  - continue exercise and low salt diet.  -  F/U 6 mos. If she does have insurance at that time she will need labs.    Howard Pouch, DO 10/18/2018

## 2018-10-18 NOTE — Patient Instructions (Signed)
telehealth

## 2018-11-04 ENCOUNTER — Other Ambulatory Visit: Payer: Self-pay

## 2018-11-04 ENCOUNTER — Ambulatory Visit (INDEPENDENT_AMBULATORY_CARE_PROVIDER_SITE_OTHER): Payer: Self-pay | Admitting: Family Medicine

## 2018-11-04 ENCOUNTER — Encounter: Payer: Self-pay | Admitting: Family Medicine

## 2018-11-04 VITALS — Ht 62.0 in

## 2018-11-04 DIAGNOSIS — IMO0002 Reserved for concepts with insufficient information to code with codable children: Secondary | ICD-10-CM

## 2018-11-04 DIAGNOSIS — R229 Localized swelling, mass and lump, unspecified: Secondary | ICD-10-CM

## 2018-11-04 NOTE — Patient Instructions (Signed)
Lymphadenopathy    Lymphadenopathy means that your lymph glands are swollen or larger than normal (enlarged). Lymph glands, also called lymph nodes, are collections of tissue that filter bacteria, viruses, and waste from your bloodstream. They are part of your body's disease-fighting system (immune system), which protects your body from germs.  There may be different causes of lymphadenopathy, depending on where it is in your body. Some types go away on their own. Lymphadenopathy can occur anywhere that you have lymph glands, including these areas:  · Neck (cervical lymphadenopathy).  · Chest (mediastinal lymphadenopathy).  · Lungs (hilar lymphadenopathy).  · Underarms (axillary lymphadenopathy).  · Groin (inguinal lymphadenopathy).  When your immune system responds to germs, infection-fighting cells and fluid build up in your lymph glands. This causes some swelling and enlargement. If the lymph glands do not go back to normal after you have an infection or disease, your health care provider may do tests. These tests help to monitor your condition and find the reason why the glands are still swollen and enlarged.  Follow these instructions at home:  · Get plenty of rest.  · Take over-the-counter and prescription medicines only as told by your health care provider. Your health care provider may recommend over-the-counter medicines for pain.  · If directed, apply heat to swollen lymph glands as often as told by your health care provider. Use the heat source that your health care provider recommends, such as a moist heat pack or a heating pad.  ? Place a towel between your skin and the heat source.  ? Leave the heat on for 20-30 minutes.  ? Remove the heat if your skin turns bright red. This is especially important if you are unable to feel pain, heat, or cold. You may have a greater risk of getting burned.  · Check your affected lymph glands every day for changes. Check other lymph gland areas as told by your health  care provider. Check for changes such as:  ? More swelling.  ? Sudden increase in size.  ? Redness or pain.  ? Hardness.  · Keep all follow-up visits as told by your health care provider. This is important.  Contact a health care provider if you have:  · Swelling that gets worse or spreads to other areas.  · Problems with breathing.  · Lymph glands that:  ? Are still swollen after 2 weeks.  ? Have suddenly gotten bigger.  ? Are red, painful, or hard.  · A fever or chills.  · Fatigue.  · A sore throat.  · Pain in your abdomen.  · Weight loss.  · Night sweats.  Get help right away if you have:  · Fluid leaking from an enlarged lymph gland.  · Severe pain.  · Chest pain.  · Shortness of breath.  Summary  · Lymphadenopathy means that your lymph glands are swollen or larger than normal (enlarged).  · Lymph glands (also called lymph nodes) are collections of tissue that filter bacteria, viruses, and waste from the bloodstream. They are part of your body's disease-fighting system (immune system).  · Lymphadenopathy can occur anywhere that you have lymph glands.  · If your enlarged and swollen lymph glands do not go back to normal after you have an infection or disease, your health care provider may do tests to monitor your condition and find the reason why the glands are still swollen and enlarged.  · Check your affected lymph glands every day for changes. Check other lymph   gland areas as told by your health care provider.  This information is not intended to replace advice given to you by your health care provider. Make sure you discuss any questions you have with your health care provider.  Document Released: 04/11/2008 Document Revised: 05/18/2017 Document Reviewed: 05/18/2017  Elsevier Interactive Patient Education © 2019 [employer]

## 2018-11-04 NOTE — Telephone Encounter (Signed)
Pt was sent my chart message with virtual visit costs without insurance

## 2018-11-04 NOTE — Progress Notes (Signed)
VIRTUAL VISIT VIA VIDEO  I connected with Kristine Kristine [name] on 11/04/18 at  2:00 PM EDT by a video enabled telemedicine application and verified that I am speaking with the correct person using two identifiers. Location Kristine Kristine [name]: Home Location provider: [facility], Office Persons participating in the virtual visit: Kristine Kristine [name], Dr. Raoul Pitch and R.Baker, LPN  I discussed the limitations of evaluation and management by telemedicine and the availability of in person appointments. The Kristine Kristine [name] expressed understanding and agreed to proceed.   SUBJECTIVE Chief Complaint  Kristine Kristine [name] presents with  . Cyst    Right side at clavical. Noticed it on Friday and has gone down in size. Was soft on Friday but it more firm today. No drainage or redness. Not warm to touch. Unable to obtain vital signs     HPI:  Kristine Kristine [name] is a 38 y.o. female Kristine [name] today for right sided mass located by Kristine Kristine [name] right clavicle.  Kristine Kristine [name] states Kristine Kristine [name] noticed this approximately 4 days ago and thinks it has become smaller since that time.  Kristine Kristine [name] stated it is more firm today.  Kristine Kristine [name] denies any redness, drainage, fever, UR symptoms  or tenderness. Kristine Kristine [name] has never had anything like this before.  Kristine Kristine [name] denies any injury. Kristine Kristine [name] does report Kristine Kristine [name] had a cyst on Kristine Kristine [name] hand when Kristine Kristine [name] was a child and had it removed. Kristine Kristine [name] mother told Kristine Kristine [name] Kristine Kristine [name] also get frequent cysts.   ROS: See pertinent positives and negatives per HPI.  Kristine Kristine [name] Active Problem List   Diagnosis Date Noted  . Overweight (BMI 25.0-29.9) 10/18/2018  . Hyperlipidemia LDL goal <130 12/14/2017  . Vitamin D deficiency 01/29/2014  . Hypertension 07/21/2013    Social History   Tobacco Use  . Smoking status: Never Smoker  . Smokeless tobacco: Never Used  Substance Use Topics  . Alcohol use: Yes    Comment: socially    Current Outpatient Medications:  .  amLODipine (NORVASC) 10 MG tablet, Take 1 tablet (10 mg total) by mouth daily. Needs office visit prior to anymore refills, Disp: 90 tablet,  Rfl: 1 .  Multiple Vitamin (MULTI-VITAMIN DAILY PO), Take 1 mg by mouth., Disp: , Rfl:   Allergies  Allergen Reactions  . Shellfish Allergy Anaphylaxis    OBJECTIVE: Ht 5\' 2"  (1.575 m)   LMP 11/03/2018 (Exact Date)   BMI 29.13 kg/m  Gen: No acute distress. Nontoxic in appearance.  HENT: AT. [place].  MMM.  Eyes:Pupils Equal Round Reactive to light, Extraocular movements intact,  Conjunctiva without redness, discharge or icterus. Chest: Cough or shortness or breath not Kristine [name].  Skin: no rashes, purpura or petechiae. Skin intact- no wounds. Small dime sized mobile mass just superior to right distal clavicle.  Neuro:  Alert. Oriented x3  Psych: Normal affect, dress and demeanor. Normal speech. Normal thought content and judgment.  ASSESSMENT AND PLAN: Kristine Kristine [name] is a 38 y.o. female Kristine [name] for  Mass - soft mobile palpable mass. Discussed poss.  Causes with Kristine Kristine [name] today.  It does not seem to be tender or infected.  Kristine Kristine [name] had no infectious symptoms such as upper respiratory or wounds of head, neck, chest wall or extremity- to cause lymph node swelling.  Kristine Kristine [name] has a history of ganglion cysts in the past.  However this is already starting to shrink in size which makes ganglion less likely.  Kristine Kristine [name] denies any axillary lymph nodes, Kristine Kristine [name] examined herself for those today during Kristine Kristine [name] visit. -Possibly reactive lymph node versus cyst.  Monitor for another 2 to 4 weeks  if not resolved by 4 weeks would want to see in person to further evaluate and consider imaging studies. -If enlarging over the next 2 to 4 weeks, redness, swelling or fever Kristine Kristine [name] needs to be seen in person. Kristine Kristine [name] understands the above recommendations and appreciates the virtual visit today.  Howard Pouch, DO 11/04/2018

## 2018-12-12 ENCOUNTER — Encounter: Payer: Self-pay | Admitting: Family Medicine

## 2018-12-17 ENCOUNTER — Ambulatory Visit (INDEPENDENT_AMBULATORY_CARE_PROVIDER_SITE_OTHER): Payer: Self-pay | Admitting: Family Medicine

## 2018-12-17 ENCOUNTER — Encounter: Payer: Self-pay | Admitting: Family Medicine

## 2018-12-17 ENCOUNTER — Other Ambulatory Visit: Payer: Self-pay

## 2018-12-17 VITALS — BP 131/86 | HR 80 | Temp 97.8°F | Resp 18 | Ht 62.0 in | Wt 156.0 lb

## 2018-12-17 DIAGNOSIS — R2230 Localized swelling, mass and lump, unspecified upper limb: Secondary | ICD-10-CM

## 2018-12-17 NOTE — Patient Instructions (Signed)
This feels like a cyst- but we will get an [place] to make sure.  If just a cyst or lipoma (fatty tumor)-- if you do not want to get it removed you don't have to.

## 2018-12-17 NOTE — Progress Notes (Signed)
Kristine [name] , 06-17-1981, 38 y.o., female MRN: [id number] Patient Care Team    Relationship Specialty Notifications Start End  Ma Hillock, DO PCP - General Family Medicine  04/28/15    Comment: Patient request female provider    Chief Complaint  Patient presents with  . Cyst    Left collar bone pt has cyst that will not go away. Not painful. No redness, swelling, or drainage. No fever or chills.      Subjective: Pt presents for an OV with complaints of mass near left collar bone of six weeks duration.  Associated symptoms include intermittent numbness medial arm to fifth finger.  She is uncertain if they are associated or if she just has anxiety surrounding the mass.  She denies any redness, swelling, drainage or pain associated with the mass.  She denies any injury of the surrounding area prior to onset of mass.  She denies any swollen lymph glands of her axillary region or neck.  She denies any breast mass. She denies any fever, chills, night sweats, rash or unintentional weight loss.  She had a cyst removed from her left hand when she was in first grade.  She states her mom also has had a few cyst removed.  Depression screen [facility] 2/9 10/18/2018 12/14/2017 05/28/2017  Decreased Interest 0 0 0  Down, Depressed, Hopeless 0 0 0  PHQ - 2 Score 0 0 0  Altered sleeping - 0 -  Tired, decreased energy - 0 -  Change in appetite - 0 -  Feeling bad or failure about yourself  - 0 -  Trouble concentrating - 0 -  Moving slowly or fidgety/restless - 0 -  Suicidal thoughts - 0 -  PHQ-9 Score - 0 -    Allergies  Allergen Reactions  . Shellfish Allergy Anaphylaxis   Social History   Social History Narrative   Ms. Pavlicek lives with her son in [place]. 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   Past Medical History:  Diagnosis Date  . Chicken pox   . Frequent headaches   . Hyperhidrosis   . Hyperlipidemia   . Hypertension    Past Surgical History:  Procedure  Laterality Date  . CESAREAN SECTION  2001   Family History  Problem Relation Age of Onset  . Hypertension Mother   . Celiac disease Son    Allergies as of 12/17/2018      Reactions   Shellfish Allergy Anaphylaxis      Medication List       Accurate as of December 17, 2018 11:14 AM. If you have any questions, ask your nurse or doctor.        amLODipine 10 MG tablet Commonly known as:  NORVASC Take 1 tablet (10 mg total) by mouth daily. Needs office visit prior to anymore refills   MULTI-VITAMIN DAILY PO Take 1 mg by mouth.       All past medical history, surgical history, allergies, family history, immunizations andmedications were updated in the EMR today and reviewed under the history and medication portions of their EMR.     ROS: Negative, with the exception of above mentioned in HPI   Objective:  BP 131/86 (BP Location: Left Arm, Patient Position: Sitting, Cuff Size: Normal)   Pulse 80   Temp 97.8 F (36.6 C) (Temporal)   Resp 18   Ht 5\' 2"  (1.575 m)   Wt 156 lb (70.8 kg)   LMP 11/26/2018  SpO2 98%   BMI 28.53 kg/m  Body mass index is 28.53 kg/m. Gen: Afebrile. No acute distress. Nontoxic in appearance, well developed, well nourished.  Very pleasant African-American female. HENT: AT. [place]. Eyes:Pupils Equal Round Reactive to light, Extraocular movements intact,  Conjunctiva without redness, discharge or icterus. Neck/lymp/endocrine: Supple, no cervical or axillary lymphadenopathy MSK: No erythema, no soft tissue swelling surrounding mass, no tenderness to palpation, and intact.  3.2 cm x 2.2 cm mobile oval cystic-like structure right anterior shoulder between acromioclavicular area.  Normal range of motion of cervical spine without discomfort.  Normal range of motion of bilateral upper extremities without discomfort.  Muscle strength 5/5 bilateral upper extremity.  Negative Tinel's at elbow.  Negative Tinel's at wrist.  Neurovascular intact distally. Skin: No rashes,  purpura or petechiae.  Neuro:  Normal gait. PERLA. EOMi. Alert. Oriented x3. Psych: Mildly anxious.  Normal affect, dress and demeanor. Normal speech. Normal thought content and judgment.  No exam data present No results found. No results found for this or any previous visit (from the past 24 hour(s)).  Assessment/Plan: Kristine [name] is a 38 y.o. female present for OV for  Shoulder mass -Discussed differential diagnoses of cyst versus lipoma versus lymphadenopathy.  Reassured her it did feel cystic in nature but cannot rule out other potential etiologies without imaging.   -Reassured her if felt to be benign after imaging surgical removal would be optional if she desired.  Would be happy to place referral for her. -Monitor numbness and tingling, if structure is causing numbness and tingling by compression been will need to be referred. - [place] MiscellaneoUS Localization; Future - naproxen Qd advised with meal.  - f/u dependent on [place] results.   Reviewed expectations re: course of current medical issues.  Discussed self-management of symptoms.  Outlined signs and symptoms indicating need for more acute intervention.  Patient verbalized understanding and all questions were answered.  Patient received an After-Visit Summary.    No orders of the defined types were placed in this encounter.   > 25 minutes spent with patient, >50% of time spent face to face counseling and  coordinating care.    Note is dictated utilizing voice recognition software. Although note has been proof read prior to signing, occasional typographical errors still can be missed. If any questions arise, please do not hesitate to call for verification.   electronically signed by:  Howard Pouch, DO  [place]

## 2018-12-18 ENCOUNTER — Other Ambulatory Visit: Payer: Self-pay

## 2018-12-18 ENCOUNTER — Encounter: Payer: Self-pay | Admitting: Family Medicine

## 2018-12-18 DIAGNOSIS — R2230 Localized swelling, mass and lump, unspecified upper limb: Secondary | ICD-10-CM

## 2018-12-18 NOTE — Progress Notes (Signed)
Correct [place] order placed incorrectly- right UPPER ext- nonvascular soft tissue

## 2018-12-18 NOTE — Addendum Note (Signed)
Addended by: Howard Pouch A on: 12/18/2018 12:07 PM   Modules accepted: Orders

## 2018-12-31 ENCOUNTER — Ambulatory Visit
Admission: RE | Admit: 2018-12-31 | Discharge: 2018-12-31 | Disposition: A | Discharge status: Home / Self Care | Payer: No Typology Code available for payment source | Source: Ambulatory Visit | Attending: Family Medicine | Admitting: Family Medicine

## 2018-12-31 DIAGNOSIS — R2230 Localized swelling, mass and lump, unspecified upper limb: Secondary | ICD-10-CM

## 2019-05-02 ENCOUNTER — Ambulatory Visit: Payer: BLUE CROSS/BLUE SHIELD | Admitting: Family Medicine

## 2019-05-02 ENCOUNTER — Encounter: Payer: Self-pay | Admitting: Family Medicine

## 2019-05-02 ENCOUNTER — Other Ambulatory Visit: Payer: Self-pay

## 2019-05-02 VITALS — BP 122/74 | HR 64 | Temp 98.0°F | Resp 16 | Ht 62.0 in | Wt 164.5 lb

## 2019-05-02 DIAGNOSIS — E785 Hyperlipidemia, unspecified: Secondary | ICD-10-CM | POA: Diagnosis not present

## 2019-05-02 DIAGNOSIS — Z23 Encounter for immunization: Secondary | ICD-10-CM | POA: Diagnosis not present

## 2019-05-02 DIAGNOSIS — E663 Overweight: Secondary | ICD-10-CM

## 2019-05-02 DIAGNOSIS — I10 Essential (primary) hypertension: Secondary | ICD-10-CM

## 2019-05-02 DIAGNOSIS — E559 Vitamin D deficiency, unspecified: Secondary | ICD-10-CM

## 2019-05-02 NOTE — Progress Notes (Signed)
Subjective:   Chief Complaint  Patient presents with  . Hypertension    Pt doing well with no complaints.     Kristine [name] is a 38 y.o. female present for [facility] follow up Vit D deficiency: 12/14/2017 vit d 20-- > prescribed high dose for 12 weeks.> lost insurance and no retest was able to be completed>> she is taking 2000 u vit d daily.   Hypertension/overweight/HLD:  Pt reports compliance with amlodipine 10 mg. Blood pressures ranges at home normal.Patient denies chest pain, shortness of breath, dizziness or lower extremity edema.  BMP: 11/2017 WNL CBC: " WNL Lipids: 11/2017 total cholesterol 239, HDL 51, LDL 171, triglycerides 84 TSH: 12/14/2017 WNL Diet: not routinely.  RF: HTN, HLD, obesity ROS: See pertinent positives and negatives per HPI.  Patient Active Problem List   Diagnosis Date Noted  . Overweight (BMI 25.0-29.9) 10/18/2018  . Hyperlipidemia LDL goal <130 12/14/2017  . Vitamin D deficiency 01/29/2014  . Hypertension 07/21/2013    Social History   Tobacco Use  . Smoking status: Never Smoker  . Smokeless tobacco: Never Used  Substance Use Topics  . Alcohol use: Yes    Comment: socially    Current Outpatient Medications:  .  amLODipine (NORVASC) 10 MG tablet, Take 1 tablet (10 mg total) by mouth daily. Needs office visit prior to anymore refills, Disp: 90 tablet, Rfl: 1 .  Multiple Vitamin (MULTI-VITAMIN DAILY PO), Take 1 mg by mouth., Disp: , Rfl:  .  Vitamin D, Cholecalciferol, 25 MCG (1000 UT) TABS, Take 2 tablets by mouth daily., Disp: , Rfl:   Allergies  Allergen Reactions  . Shellfish Allergy Anaphylaxis    Objective:  BP 122/74 (BP Location: Right Arm, Patient Position: Sitting, Cuff Size: Normal)   Pulse 64   Temp 98 F (36.7 C) (Temporal)   Resp 16   Ht 5' 2"  (1.575 m)   Wt 164 lb 8 oz (74.6 kg)   LMP 04/02/2019 (Exact Date)   SpO2 100%   BMI 30.09 kg/m  Gen: Afebrile. No acute distress. Nontoxic pleas nat AAf, overweight.   HENT: AT. [place].  Eyes:Pupils Equal Round Reactive to light, Extraocular movements intact,  Conjunctiva without redness, discharge or icterus. Neck/lymp/endocrine: Supple,no lymphadenopathy, no thyromegaly CV: RRR no murmur, no edema, +2/4 P posterior tibialis pulses Chest: CTAB, no wheeze or crackles Abd: Soft. NTND. BS present. no Masses palpated.  Skin: no rashes, purpura or petechiae.  Neuro:  Normal gait. PERLA. EOMi. Alert. Oriented x3  Psych: Normal affect, dress and demeanor. Normal speech. Normal thought content and judgment.   Assessment and Plan:  Kristine [name] is a 38 y.o. AAF Vitamin D deficiency - Taking Vit D OTC 1000u-2000u a day. - repeat levels today. We did not get the chance to recheck after prescribed supplement 2/2 to loss of insurance.    Essential hypertension/Hyperlipidemia LDL goal <130/overweight - stable. Continue  amlodipine 10 mg QD. Refills  provided today for 6 months.  - currently not taking statin - due to loss of insurance a few months ago. No insured. Lipid level today, if appropriate will restart statin.  - continue exercise and low salt diet.  - cbc, cmp, lipid, tsh collected today. - F/U 6 mos.  Influenza immunization provided today.   Orders Placed This Encounter  Procedures  . Flu Vaccine QUAD 36+ mos IM  . CBC  . Comp Met (CMET)  . Lipid panel  . TSH  . Vitamin D (25 hydroxy)  >  25 minutes spent with patient, >50% of time spent face to face    Howard Pouch, DO 05/02/2019

## 2019-05-02 NOTE — Patient Instructions (Addendum)
Great to see you today! BP looks great! I have refilled your med for you.  We will call you with lab results.   Follow up in 6 months.

## 2019-05-03 LAB — LIPID PANEL
Cholesterol: 257 mg/dL — ABNORMAL HIGH (ref <200)
HDL: 53 mg/dL (ref >=50)
LDL Cholesterol (Calc): 184 mg/dL (calc) — ABNORMAL HIGH
Non-HDL Cholesterol (Calc): 204 mg/dL (calc) — ABNORMAL HIGH (ref <130)
Total CHOL/HDL Ratio: 4.8 (calc) (ref <5.0)
Triglycerides: 86 mg/dL (ref <150)

## 2019-05-03 LAB — COMPREHENSIVE METABOLIC PANEL
AG Ratio: 1.1 (calc) (ref 1.0–2.5)
ALT: 14 U/L (ref 6–29)
AST: 15 U/L (ref 10–30)
Albumin: 4.3 g/dL (ref 3.6–5.1)
Alkaline phosphatase (APISO): 60 U/L (ref 31–125)
BUN: 11 mg/dL (ref 7–25)
CO2: 19 mmol/L — ABNORMAL LOW (ref 20–32)
Calcium: 10.2 mg/dL (ref 8.6–10.2)
Chloride: 103 mmol/L (ref 98–110)
Creat: 0.78 mg/dL (ref 0.50–1.10)
Globulin: 3.8 g/dL (calc) — ABNORMAL HIGH (ref 1.9–3.7)
Glucose, Bld: 81 mg/dL (ref 65–99)
Potassium: 4.3 mmol/L (ref 3.5–5.3)
Sodium: 134 mmol/L — ABNORMAL LOW (ref 135–146)
Total Bilirubin: 0.4 mg/dL (ref 0.2–1.2)
Total Protein: 8.1 g/dL (ref 6.1–8.1)

## 2019-05-03 LAB — CBC
HCT: 38.1 % (ref 35.0–45.0)
Hemoglobin: 12.5 g/dL (ref 11.7–15.5)
MCH: 26.2 pg — ABNORMAL LOW (ref 27.0–33.0)
MCHC: 32.8 g/dL (ref 32.0–36.0)
MCV: 79.7 fL — ABNORMAL LOW (ref 80.0–100.0)
MPV: 10.5 fL (ref 7.5–12.5)
Platelets: 430 10*3/uL — ABNORMAL HIGH (ref 140–400)
RBC: 4.78 10*6/uL (ref 3.80–5.10)
RDW: 13.1 % (ref 11.0–15.0)
WBC: 7.8 10*3/uL (ref 3.8–10.8)

## 2019-05-03 LAB — VITAMIN D 25 HYDROXY (VIT D DEFICIENCY, FRACTURES): Vit D, 25-Hydroxy: 33 ng/mL (ref 30–100)

## 2019-05-03 LAB — TSH: TSH: 0.72 mIU/L

## 2019-05-05 ENCOUNTER — Other Ambulatory Visit: Payer: Self-pay

## 2019-05-05 ENCOUNTER — Telehealth: Payer: Self-pay | Admitting: Family Medicine

## 2019-05-05 MED ORDER — AMLODIPINE BESYLATE 10 MG PO TABS: 10.0000 mg | ORAL_TABLET | Freq: Every day | ORAL | 1 refills | Status: DC

## 2019-05-05 MED ORDER — ATORVASTATIN CALCIUM 20 MG PO TABS: 20.0000 mg | ORAL_TABLET | Freq: Every day | ORAL | 3 refills | Status: DC

## 2019-05-05 NOTE — Telephone Encounter (Signed)
Please inform patient the following information: -Her vitamin D is in the low normal range, she reports taking 2000 units daily, would encourage her to increase to 3000 units daily with food. -Her cholesterol is high with a total cholesterol 257 and a LDL/bad cholesterol of 184.  A cholesterol medication is indicated.  LDL goal below 130, closer to 100 is best.  However, she has to be very cautious as statins can be used during pregnancy-and she is still of the childbearing age, and she is not on birth control.    -I have called in a statin to start.  Follow-up in 3 months with provider -please schedule her.  We will complete fasting labs during this appointment to recheck her cholesterol on medications.

## 2019-05-05 NOTE — Telephone Encounter (Signed)
Refill request for Amlodopine 10mg  from [facility].  LOV:05/02/2019 Next ov: Not scheduled   Last written: 10/18/2018 #90 x1 refill    Medication refilled and  Sent to pharmacy

## 2019-05-05 NOTE — Telephone Encounter (Signed)
Pt was called and given results/instructions, appt scheduled. Pt verbalized understanding

## 2019-08-01 DIAGNOSIS — F411 Generalized anxiety disorder: Secondary | ICD-10-CM | POA: Diagnosis not present

## 2019-08-07 ENCOUNTER — Encounter: Payer: Self-pay | Admitting: Family Medicine

## 2019-08-07 ENCOUNTER — Ambulatory Visit: Payer: BLUE CROSS/BLUE SHIELD | Admitting: Family Medicine

## 2019-08-07 ENCOUNTER — Other Ambulatory Visit: Payer: Self-pay

## 2019-08-07 VITALS — BP 108/71 | HR 74 | Temp 97.9°F | Resp 16 | Ht 62.0 in | Wt 165.2 lb

## 2019-08-07 DIAGNOSIS — I10 Essential (primary) hypertension: Secondary | ICD-10-CM

## 2019-08-07 DIAGNOSIS — E669 Obesity, unspecified: Secondary | ICD-10-CM | POA: Diagnosis not present

## 2019-08-07 DIAGNOSIS — R718 Other abnormality of red blood cells: Secondary | ICD-10-CM | POA: Diagnosis not present

## 2019-08-07 DIAGNOSIS — E785 Hyperlipidemia, unspecified: Secondary | ICD-10-CM

## 2019-08-07 DIAGNOSIS — Z79899 Other long term (current) drug therapy: Secondary | ICD-10-CM

## 2019-08-07 LAB — CBC
HCT: 39 % (ref 36.0–46.0)
Hemoglobin: 12.8 g/dL (ref 12.0–15.0)
MCHC: 32.7 g/dL (ref 30.0–36.0)
MCV: 81.3 fl (ref 78.0–100.0)
Platelets: 425 10*3/uL — ABNORMAL HIGH (ref 150.0–400.0)
RBC: 4.79 Mil/uL (ref 3.87–5.11)
RDW: 13.4 % (ref 11.5–15.5)
WBC: 6.9 10*3/uL (ref 4.0–10.5)

## 2019-08-07 LAB — HEPATIC FUNCTION PANEL
ALT: 19 U/L (ref 0–35)
AST: 17 U/L (ref 0–37)
Albumin: 4.4 g/dL (ref 3.5–5.2)
Alkaline Phosphatase: 81 U/L (ref 39–117)
Bilirubin, Direct: 0.1 mg/dL (ref 0.0–0.3)
Total Bilirubin: 0.3 mg/dL (ref 0.2–1.2)
Total Protein: 8 g/dL (ref 6.0–8.3)

## 2019-08-07 LAB — LIPID PANEL
Cholesterol: 170 mg/dL (ref 0–200)
HDL: 52.9 mg/dL (ref >39.00)
LDL Cholesterol: 101 mg/dL — ABNORMAL HIGH (ref 0–99)
NonHDL: 116.94
Total CHOL/HDL Ratio: 3
Triglycerides: 78 mg/dL (ref 0.0–149.0)
VLDL: 15.6 mg/dL (ref 0.0–40.0)

## 2019-08-07 NOTE — Patient Instructions (Signed)
We will call you with lab results.  Nice to see you. Have a great day.   High Cholesterol  High cholesterol is a condition in which the blood has high levels of a white, waxy, fat-like substance (cholesterol). The human body needs small amounts of cholesterol. The liver makes all the cholesterol that the body needs. Extra (excess) cholesterol comes from the food that we eat. Cholesterol is carried from the liver by the blood through the blood vessels. If you have high cholesterol, deposits (plaques) may build up on the walls of your blood vessels (arteries). Plaques make the arteries narrower and stiffer. Cholesterol plaques increase your risk for heart attack and stroke. Work with your health care provider to keep your cholesterol levels in a healthy range. What increases the risk? This condition is more likely to develop in people who:  Eat foods that are high in animal fat (saturated fat) or cholesterol.  Are overweight.  Are not getting enough exercise.  Have a family history of high cholesterol. What are the signs or symptoms? There are no symptoms of this condition. How is this diagnosed? This condition may be diagnosed from the results of a blood test.  If you are older than age 65, your health care provider may check your cholesterol every 4-6 years.  You may be checked more often if you already have high cholesterol or other risk factors for heart disease. The blood test for cholesterol measures:  "Bad" cholesterol (LDL cholesterol). This is the main type of cholesterol that causes heart disease. The desired level for LDL is less than 100.  "Good" cholesterol (HDL cholesterol). This type helps to protect against heart disease by cleaning the arteries and carrying the LDL away. The desired level for HDL is 60 or higher.  Triglycerides. These are fats that the body can store or burn for energy. The desired number for triglycerides is lower than 150.  Total cholesterol. This is  a measure of the total amount of cholesterol in your blood, including LDL cholesterol, HDL cholesterol, and triglycerides. A healthy number is less than 200. How is this treated? This condition is treated with diet changes, lifestyle changes, and medicines. Diet changes  This may include eating more whole grains, fruits, vegetables, nuts, and fish.  This may also include cutting back on red meat and foods that have a lot of added sugar. Lifestyle changes  Changes may include getting at least 40 minutes of aerobic exercise 3 times a week. Aerobic exercises include walking, biking, and swimming. Aerobic exercise along with a healthy diet can help you maintain a healthy weight.  Changes may also include quitting smoking. Medicines  Medicines are usually given if diet and lifestyle changes have failed to reduce your cholesterol to healthy levels.  Your health care provider may prescribe a statin medicine. Statin medicines have been shown to reduce cholesterol, which can reduce the risk of heart disease. Follow these instructions at home: Eating and drinking If told by your health care provider:  Eat chicken (without skin), fish, veal, shellfish, ground [place] breast, and round or loin cuts of red meat.  Do not eat fried foods or fatty meats, such as hot dogs and salami.  Eat plenty of fruits, such as apples.  Eat plenty of vegetables, such as broccoli, potatoes, and carrots.  Eat beans, peas, and lentils.  Eat grains such as barley, rice, couscous, and bulgur wheat.  Eat pasta without cream sauces.  Use skim or nonfat milk, and eat low-fat  or nonfat yogurt and cheeses.  Do not eat or drink whole milk, cream, ice cream, egg yolks, or hard cheeses.  Do not eat stick margarine or tub margarines that contain trans fats (also called partially hydrogenated oils).  Do not eat saturated tropical oils, such as coconut oil and palm oil.  Do not eat cakes, cookies, crackers, or other  baked goods that contain trans fats.  General instructions  Exercise as directed by your health care provider. Increase your activity level with activities such as gardening, walking, and taking the stairs.  Take over-the-counter and prescription medicines only as told by your health care provider.  Do not use any products that contain nicotine or tobacco, such as cigarettes and e-cigarettes. If you need help quitting, ask your health care provider.  Keep all follow-up visits as told by your health care provider. This is important. Contact a health care provider if:  You are struggling to maintain a healthy diet or weight.  You need help to start on an exercise program.  You need help to stop smoking. Get help right away if:  You have chest pain.  You have trouble breathing. This information is not intended to replace advice given to you by your health care provider. Make sure you discuss any questions you have with your health care provider. Document Revised: 07/06/2017 Document Reviewed: 01/01/2016 Elsevier Patient Education  [place].

## 2019-08-07 NOTE — Progress Notes (Signed)
This visit occurred during the SARS-CoV-2 public health emergency.  Safety protocols were in place, including screening questions prior to the visit, additional usage of staff PPE, and extensive cleaning of exam room while observing appropriate contact time as indicated for disinfecting solutions.    Kristine [name] , 01-08-1981, 39 y.o., female MRN: [id number] Patient Care Team    Relationship Specialty Notifications Start End  Ma Hillock, DO PCP - General Family Medicine  04/28/15    Comment: Patient request female provider    Chief Complaint  Patient presents with  . Hyperlipidemia    Pt is doing well with no complaints   . Hypertension     Subjective: Kristine [name] is a 39 y.o.  Female present for HLD follow up after starting statin therapy  Vit D deficiency:low normal last check 3 mos ago>> she is taking 3000 now (increased form 2000u)   Hypertension/overweight/HLD: Pt reports compliance withamlodipine 10 mg. Blood pressures ranges at homenormal.Patient denies chest pain, shortness of breath, dizziness or lower extremity edema.  Labs UTD 04/2019 Diet: not routinely.  RF: HTN,HLD,obesity ROS: See pertinent positives and negatives per HPI.  Depression screen [facility] 2/9 10/18/2018 12/14/2017 05/28/2017  Decreased Interest 0 0 0  Down, Depressed, Hopeless 0 0 0  PHQ - 2 Score 0 0 0  Altered sleeping - 0 -  Tired, decreased energy - 0 -  Change in appetite - 0 -  Feeling bad or failure about yourself  - 0 -  Trouble concentrating - 0 -  Moving slowly or fidgety/restless - 0 -  Suicidal thoughts - 0 -  PHQ-9 Score - 0 -    Allergies  Allergen Reactions  . Shellfish Allergy Anaphylaxis   Social History   Social History Narrative   Kristine [name] lives with her son in [place]. 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   Past Medical History:  Diagnosis Date  . Chicken pox   . Frequent headaches   . Hyperhidrosis   . Hyperlipidemia   .  Hypertension    Past Surgical History:  Procedure Laterality Date  . CESAREAN SECTION  2001   Family History  Problem Relation Age of Onset  . Hypertension Mother   . Celiac disease Son    Allergies as of 08/07/2019      Reactions   Shellfish Allergy Anaphylaxis      Medication List       Accurate as of August 07, 2019  8:10 AM. If you have any questions, ask your nurse or doctor.        amLODipine 10 MG tablet Commonly known as: NORVASC Take 1 tablet (10 mg total) by mouth daily. Needs office visit prior to anymore refills   atorvastatin 20 MG tablet Commonly known as: LIPITOR Take 1 tablet (20 mg total) by mouth daily.   MULTI-VITAMIN DAILY PO Take 1 mg by mouth.   Vitamin D (Cholecalciferol) 25 MCG (1000 UT) Tabs Take 2 tablets by mouth daily.       All past medical history, surgical history, allergies, family history, immunizations andmedications were updated in the EMR today and reviewed under the history and medication portions of their EMR.     ROS: Negative, with the exception of above mentioned in HPI   Objective:  BP 108/71 (BP Location: Right Arm, Patient Position: Sitting, Cuff Size: Normal)   Pulse 74   Temp 97.9 F (36.6 C) (Temporal)   Resp 16  Ht 5\' 2"  (1.575 m)   Wt 165 lb 4 oz (75 kg)   LMP 08/03/2019 (Exact Date)   SpO2 99%   BMI 30.22 kg/m  Body mass index is 30.22 kg/m. Gen: Afebrile. No acute distress. Nontoxic in appearance, well developed, well nourished.  HENT: AT. [place].  Eyes:Pupils Equal Round Reactive to light, Extraocular movements intact,  Conjunctiva without redness, discharge or icterus. CV: RRR no murmur, no edema Chest: CTAB, no wheeze or crackles. Good air movement, normal resp effort.  Skin: no rashes, purpura or petechiae.  Neuro: Normal gait. PERLA. EOMi. Alert. Oriented x3   No exam data present No results found. No results found for this or any previous visit (from the past 24  hour(s)).  Assessment/Plan: Kristine [name] is a 39 y.o. female present for OV for  Essential hypertension/Hyperlipidemia LDL goal <130/overweight - BP looks great.  Continue  amlodipine 10 mg QD.  - tolerating statin- continue liptior 20- levels rechecked today.  - continue exercise and low salt diet.  - F/U 6 mos.  Microcytosis: - iron panel collected today.  - no personal or fhx of hbg d/o she is aware of.    Reviewed expectations re: course of current medical issues.  Discussed self-management of symptoms.  Outlined signs and symptoms indicating need for more acute intervention.  Patient verbalized understanding and all questions were answered.  Patient received an After-Visit Summary.    Orders Placed This Encounter  Procedures  . Lipid panel  . Hepatic function panel  . CBC  . Iron, TIBC and Ferritin Panel     Note is dictated utilizing voice recognition software. Although note has been proof read prior to signing, occasional typographical errors still can be missed. If any questions arise, please do not hesitate to call for verification.   electronically signed by:  Howard Pouch, DO  [place]

## 2019-08-08 LAB — IRON,TIBC AND FERRITIN PANEL
%SAT: 13 % (calc) — ABNORMAL LOW (ref 16–45)
Ferritin: 22 ng/mL (ref 16–154)
Iron: 44 ug/dL (ref 40–190)
TIBC: 347 mcg/dL (calc) (ref 250–450)

## 2019-08-22 DIAGNOSIS — F411 Generalized anxiety disorder: Secondary | ICD-10-CM | POA: Diagnosis not present

## 2019-09-12 DIAGNOSIS — F411 Generalized anxiety disorder: Secondary | ICD-10-CM | POA: Diagnosis not present

## 2019-09-26 DIAGNOSIS — F411 Generalized anxiety disorder: Secondary | ICD-10-CM | POA: Diagnosis not present

## 2019-09-26 DIAGNOSIS — F331 Major depressive disorder, recurrent, moderate: Secondary | ICD-10-CM | POA: Diagnosis not present

## 2019-10-03 DIAGNOSIS — F411 Generalized anxiety disorder: Secondary | ICD-10-CM | POA: Diagnosis not present

## 2019-10-09 DIAGNOSIS — F411 Generalized anxiety disorder: Secondary | ICD-10-CM | POA: Diagnosis not present

## 2019-10-09 DIAGNOSIS — F331 Major depressive disorder, recurrent, moderate: Secondary | ICD-10-CM | POA: Diagnosis not present

## 2019-10-17 DIAGNOSIS — F411 Generalized anxiety disorder: Secondary | ICD-10-CM | POA: Diagnosis not present

## 2019-10-31 ENCOUNTER — Other Ambulatory Visit: Payer: Self-pay

## 2019-10-31 MED ORDER — AMLODIPINE BESYLATE 10 MG PO TABS: 10.0000 mg | ORAL_TABLET | Freq: Every day | ORAL | 0 refills | Status: DC

## 2020-01-07 ENCOUNTER — Ambulatory Visit: Payer: Self-pay | Admitting: Family Medicine

## 2020-01-07 ENCOUNTER — Other Ambulatory Visit: Payer: Self-pay

## 2020-01-07 ENCOUNTER — Encounter: Payer: Self-pay | Admitting: Family Medicine

## 2020-01-07 VITALS — BP 124/83 | HR 84 | Temp 98.0°F | Resp 17 | Ht 62.0 in | Wt 167.1 lb

## 2020-01-07 DIAGNOSIS — I10 Essential (primary) hypertension: Secondary | ICD-10-CM

## 2020-01-07 DIAGNOSIS — Z79899 Other long term (current) drug therapy: Secondary | ICD-10-CM

## 2020-01-07 DIAGNOSIS — E785 Hyperlipidemia, unspecified: Secondary | ICD-10-CM

## 2020-01-07 MED ORDER — AMLODIPINE BESYLATE 10 MG PO TABS: 10.0000 mg | ORAL_TABLET | Freq: Every day | ORAL | 1 refills | Status: DC

## 2020-01-07 NOTE — Patient Instructions (Signed)
Your BP looks good.  I have refilled your med.  Follow up in 5.5 mos.   Good luck!!!

## 2020-01-07 NOTE — Progress Notes (Signed)
This visit occurred during the SARS-CoV-2 public health emergency.  Safety protocols were in place, including screening questions prior to the visit, additional usage of staff PPE, and extensive cleaning of exam room while observing appropriate contact time as indicated for disinfecting solutions.    Kristine [name] , 12-27-1980, 39 y.o., female MRN: [id number] Patient Care Team    Relationship Specialty Notifications Start End  Ma Hillock, DO PCP - General Family Medicine  04/28/15    Comment: Patient request female provider    Chief Complaint  Patient presents with  . Hypertension    Needs refills      Subjective: Kristine [name] is a 39 y.o.  Female present for  Vit D deficiency:low normal last check 3 mos ago>> she is taking 3000 now (increased form 2000u)   Hypertension/overweight/HLD: Pt reports compliance withamlodipine 10 mg. Blood pressures ranges at homeWNL.Patient denies chest pain, shortness of breath, dizziness or lower extremity edema.  Labs UTD 07/2019 Diet: not routinely.  RF: HTN,HLD,obesity ROS: See pertinent positives and negatives per HPI.  Depression screen [facility] 2/9 10/18/2018 12/14/2017 05/28/2017  Decreased Interest 0 0 0  Down, Depressed, Hopeless 0 0 0  PHQ - 2 Score 0 0 0  Altered sleeping - 0 -  Tired, decreased energy - 0 -  Change in appetite - 0 -  Feeling bad or failure about yourself  - 0 -  Trouble concentrating - 0 -  Moving slowly or fidgety/restless - 0 -  Suicidal thoughts - 0 -  PHQ-9 Score - 0 -    Allergies  Allergen Reactions  . Shellfish Allergy Anaphylaxis   Social History   Social History Narrative   Kristine [name] lives with her son in [place]. 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   Past Medical History:  Diagnosis Date  . Chicken pox   . Frequent headaches   . Hyperhidrosis   . Hyperlipidemia   . Hypertension    Past Surgical History:  Procedure Laterality Date  . CESAREAN SECTION  2001    Family History  Problem Relation Age of Onset  . Hypertension Mother   . Celiac disease Son    Allergies as of 01/07/2020      Reactions   Shellfish Allergy Anaphylaxis      Medication List       Accurate as of January 07, 2020  8:06 AM. If you have any questions, ask your nurse or doctor.        amLODipine 10 MG tablet Commonly known as: NORVASC Take 1 tablet (10 mg total) by mouth daily. Needs office visit prior to anymore refills   atorvastatin 20 MG tablet Commonly known as: LIPITOR Take 1 tablet (20 mg total) by mouth daily.   desvenlafaxine 100 MG 24 hr tablet Commonly known as: PRISTIQ Take 100 mg by mouth daily.   Iron 325 (65 Fe) MG Tabs Take by mouth. 3x a week   MULTI-VITAMIN DAILY PO Take 1 mg by mouth.   Vitamin D (Cholecalciferol) 25 MCG (1000 UT) Tabs Take 2 tablets by mouth daily.       All past medical history, surgical history, allergies, family history, immunizations andmedications were updated in the EMR today and reviewed under the history and medication portions of their EMR.     ROS: Negative, with the exception of above mentioned in HPI   Objective:  BP 124/83 (BP Location: Left Arm, Patient Position: Sitting, Cuff Size: Normal)  Pulse 84   Temp 98 F (36.7 C) (Temporal)   Resp 17   Ht 5\' 2"  (1.575 m)   Wt 167 lb 2 oz (75.8 kg)   LMP 01/03/2020 (Exact Date)   SpO2 98%   BMI 30.57 kg/m  Body mass index is 30.57 kg/m. Gen: Afebrile. No acute distress. Nontoxic. Pleasant female.  HENT: AT. [place].  Eyes:Pupils Equal Round Reactive to light, Extraocular movements intact,  Conjunctiva without redness, discharge or icterus. CV: RRR no murmur, no edema, +2/4 P posterior tibialis pulses Chest: CTAB, no wheeze or crackles Skin: no rashes, purpura or petechiae.  Neuro:  Normal gait. PERLA. EOMi. Alert. Oriented x3 Psych: Normal affect, dress and demeanor. Normal speech. Normal thought content and judgment.    No exam data present No  results found. No results found for this or any previous visit (from the past 24 hour(s)).  Assessment/Plan: Kristine [name] is a 39 y.o. female present for OV for  Essential hypertension/Hyperlipidemia LDL goal <130/on statin - stable.  -continue amlodipine 10 mg QD.  - continue  liptior 20 Labs utd - continue exercise and low salt diet.  - F/U 5.5 mos.     Reviewed expectations re: course of current medical issues.  Discussed self-management of symptoms.  Outlined signs and symptoms indicating need for more acute intervention.  Patient verbalized understanding and all questions were answered.  Patient received an After-Visit Summary.    No orders of the defined types were placed in this encounter.    Note is dictated utilizing voice recognition software. Although note has been proof read prior to signing, occasional typographical errors still can be missed. If any questions arise, please do not hesitate to call for verification.   electronically signed by:  Howard Pouch, DO  [place]

## 2020-02-05 ENCOUNTER — Encounter: Payer: Self-pay | Admitting: Family Medicine

## 2020-02-05 NOTE — Telephone Encounter (Signed)
We could take over management of her Pristiq if she desired.  We would need to see her in order to take over that [place] initial appointment we would manage with her routine chronic condition appointments. If she does desire [place] to take over her Pristiq medication please schedule her for an appointment and also request records from her psychiatry team.   Thanks.

## 2020-02-05 NOTE — Telephone Encounter (Signed)
Patient sent the following mychart message regarding prescription request; "Good morning.  I am wondering if Dr. Raoul Pitch would be able to prescribe my Pristiq for me if I stop seeing my psychiatrist, and only go to my therapist?  I currently do not have insurance, and just trying to see if I can save the costs of going to him just to get the prescription.  Thank you!". She was last seen 01/07/20 for blood pressure follow up.  Please advise, thanks.

## 2020-07-13 ENCOUNTER — Other Ambulatory Visit: Payer: Self-pay | Admitting: Family Medicine

## 2020-08-12 ENCOUNTER — Other Ambulatory Visit: Payer: Self-pay | Admitting: Family Medicine

## 2020-09-09 ENCOUNTER — Other Ambulatory Visit: Payer: Self-pay | Admitting: Family Medicine

## 2020-10-11 ENCOUNTER — Other Ambulatory Visit: Payer: Self-pay

## 2020-10-11 ENCOUNTER — Ambulatory Visit (INDEPENDENT_AMBULATORY_CARE_PROVIDER_SITE_OTHER): Payer: Self-pay | Admitting: Family Medicine

## 2020-10-11 ENCOUNTER — Encounter: Payer: Self-pay | Admitting: Family Medicine

## 2020-10-11 ENCOUNTER — Other Ambulatory Visit: Payer: Self-pay | Admitting: Family Medicine

## 2020-10-11 VITALS — BP 153/99 | HR 81 | Temp 98.7°F | Ht 62.0 in | Wt 168.0 lb

## 2020-10-11 DIAGNOSIS — F410 Panic disorder [episodic paroxysmal anxiety] without agoraphobia: Secondary | ICD-10-CM | POA: Insufficient documentation

## 2020-10-11 DIAGNOSIS — E785 Hyperlipidemia, unspecified: Secondary | ICD-10-CM

## 2020-10-11 DIAGNOSIS — E669 Obesity, unspecified: Secondary | ICD-10-CM

## 2020-10-11 DIAGNOSIS — I10 Essential (primary) hypertension: Secondary | ICD-10-CM

## 2020-10-11 DIAGNOSIS — G479 Sleep disorder, unspecified: Secondary | ICD-10-CM | POA: Insufficient documentation

## 2020-10-11 DIAGNOSIS — F418 Other specified anxiety disorders: Secondary | ICD-10-CM | POA: Insufficient documentation

## 2020-10-11 MED ORDER — PAROXETINE HCL 20 MG PO TABS
20.0000 mg | ORAL_TABLET | Freq: Every day | ORAL | 5 refills | Status: DC
Start: 2020-10-11 — End: 2021-04-12

## 2020-10-11 MED ORDER — DESVENLAFAXINE SUCCINATE ER 25 MG PO TB24: ORAL_TABLET | ORAL | 0 refills | Status: DC

## 2020-10-11 MED ORDER — AMLODIPINE BESYLATE 10 MG PO TABS: 10.0000 mg | ORAL_TABLET | Freq: Every day | ORAL | 5 refills | Status: DC

## 2020-10-11 MED ORDER — HYDROXYZINE HCL 25 MG PO TABS: 12.5000 mg | ORAL_TABLET | Freq: Three times a day (TID) | ORAL | 1 refills | Status: DC | PRN

## 2020-10-11 NOTE — Progress Notes (Signed)
This visit occurred during the SARS-CoV-2 public health emergency.  Safety protocols were in place, including screening questions prior to the visit, additional usage of staff PPE, and extensive cleaning of exam room while observing appropriate contact time as indicated for disinfecting solutions.    Kristine [name] , May 10, 1981, 40 y.o., female MRN: [id number] Patient Care Team    Relationship Specialty Notifications Start End  Ma Hillock, DO PCP - General Family Medicine  04/28/15    Comment: Patient request female provider    Chief Complaint  Patient presents with  . Follow-up    [facility]  . Anxiety     Subjective: Kristine [name] is a 40 y.o.  Female present for  Hypertension/overweight/HLD: Pt reports noncompliance withamlodipine 10 mg. She has been out of her meds for about 6 months. She no longer has a job and has lost her insurance. She tried to apply for medicaid. She is having headaches.  Diet: not routinely.  RF: HTN,HLD,obesity ROS: See pertinent positives and negatives per HPI.  Depression with anxiety/sleep disturbance:  In the past she has been prescribed buspar and paxil. Buspar was not helpful- but she did well with paxil. She had not been prescribed meds in sometime (2017).  She had been attending therapy sessions and her therapist reccommended she see a psychiatrist. The psychiatrist started her on pristiq 100, wellbutrin 150 added later, then seroquel and xanax. She reports the majority of her symptoms are related to anxiety, but she endorses depression as well. She has panic attacks at times and uses the xanax prescribed by psych at those times (rarely needs. She endorses headache and shakiness yesterday- she assumed it was from abruptly stopping pristiq.   Depression screen [facility] 2/9 10/11/2020 10/18/2018 12/14/2017 05/28/2017  Decreased Interest 0 0 0 0  Down, Depressed, Hopeless 2 0 0 0  PHQ - 2 Score 2 0 0 0  Altered sleeping 3 - 0 -  Tired, decreased  energy 3 - 0 -  Change in appetite 3 - 0 -  Feeling bad or failure about yourself  3 - 0 -  Trouble concentrating 3 - 0 -  Moving slowly or fidgety/restless 1 - 0 -  Suicidal thoughts 1 - 0 -  PHQ-9 Score 19 - 0 -    Allergies  Allergen Reactions  . Shellfish Allergy Anaphylaxis   Social History   Social History Narrative   Ms. Bartnick lives with her son in [place]. 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   Past Medical History:  Diagnosis Date  . Chicken pox   . Frequent headaches   . Hyperhidrosis   . Hyperlipidemia   . Hypertension    Past Surgical History:  Procedure Laterality Date  . CESAREAN SECTION  2001   Family History  Problem Relation Age of Onset  . Hypertension Mother   . Celiac disease Son    Allergies as of 10/11/2020      Reactions   Shellfish Allergy Anaphylaxis      Medication List       Accurate as of October 11, 2020  4:28 PM. If you have any questions, ask your nurse or doctor.        STOP taking these medications   buPROPion 150 MG 24 hr tablet Commonly known as: WELLBUTRIN XL Stopped by: Howard Pouch, DO   QUEtiapine 25 MG tablet Commonly known as: SEROQUEL Stopped by: Howard Pouch, DO     TAKE these medications  ALPRAZolam 0.25 MG tablet Commonly known as: XANAX Take 0.25 mg by mouth at bedtime as needed for anxiety.   amLODipine 10 MG tablet Commonly known as: NORVASC Take 1 tablet (10 mg total) by mouth daily.   atorvastatin 20 MG tablet Commonly known as: LIPITOR Take 1 tablet (20 mg total) by mouth daily.   Desvenlafaxine Succinate ER 25 MG Tb24 Commonly known as: Pristiq 2 tabs PO for 3 days, then 1 tab PO for 1 week, then 1 tab PO QOD for 3 doses. What changed:   medication strength  how much to take  how to take this  when to take this  additional instructions Changed by: Howard Pouch, DO   hydrOXYzine 25 MG tablet Commonly known as: ATARAX/VISTARIL Take 0.5-1 tablets (12.5-25 mg total) by  mouth 3 (three) times daily as needed. Started by: Howard Pouch, DO   Iron 325 (65 Fe) MG Tabs Take by mouth. 3x a week   MULTI-VITAMIN DAILY PO Take 1 mg by mouth.   PARoxetine 20 MG tablet Commonly known as: Paxil Take 1 tablet (20 mg total) by mouth daily. Started by: Howard Pouch, DO   Vitamin D (Cholecalciferol) 25 MCG (1000 UT) Tabs Take 2 tablets by mouth daily.       All past medical history, surgical history, allergies, family history, immunizations andmedications were updated in the EMR today and reviewed under the history and medication portions of their EMR.     ROS: Negative, with the exception of above mentioned in HPI   Objective:  BP (!) 153/99   Pulse 81   Temp 98.7 F (37.1 C) (Oral)   Ht 5\' 2"  (1.575 m)   Wt 168 lb (76.2 kg)   SpO2 97%   BMI 30.73 kg/m  Body mass index is 30.73 kg/m. Gen: Afebrile. No acute distress. Nontoxic, pleasant female.  HENT: AT. [place].  Eyes:Pupils Equal Round Reactive to light, Extraocular movements intact,  Conjunctiva without redness, discharge or icterus. Neck/lymp/endocrine: Supple,no lymphadenopathy, no thyromegaly CV: RRR no murmurn, no edema, +2/4 P posterior tibialis pulses Chest: CTAB, no wheeze or crackles  Neuro:  Normal gait. PERLA. EOMi. Alert. Oriented x3 Psych: Normal affect, dress and demeanor. Normal speech. Normal thought content and judgment.  No exam data present No results found. No results found for this or any previous visit (from the past 24 hour(s)).  Assessment/Plan: Kristine [name] is a 40 y.o. female present for OV for  Essential hypertension/Hyperlipidemia LDL goal <130/on statin/obesity - Has been w/out meds Restart amlodipine 10 mg QD.  - hold lipitor until she can afford meds.  Labs utd - continue exercise and low salt diet.  - F/U 5.5 mos.  Anxiety with depression/Panic attacks/sleep disturbance Lengthy discussion today surrounding patient's prior anxiety and depression treatment.   She is retired Insurance underwriter currently.  Will attempt to simplify regimen as much as possible and choose economically feasible options.  She is working with her pharmacist to help with cost and good Rx program -Start Paxil 20 mg daily -Start Vistaril 12.5-25 mg nightly, and can use a half a tab for panic if necessary. -Discontinue Wellbutrin and discontinue Seroquel -Pristiq-believes she is having withdrawal symptoms from Pristiq 100 mg tabs which she ran out of 4 days ago.  Prescribed Pristiq 25 mg tabs with tapering instructions from 50 mg down to 12.5 mg and then discontinuation. Tried meds: BuSpar- did not work well.  Paxil-worked well.  Pristiq-did not seem to work well since needing to continue to  increase dose and add other meds.  Wellbutrin-may have worked okay but ran out.  Xanax-rarely needs.  Seroquel-madetoo sleepy even on 12.5 mg F/u 5.5 mos- sooner if needed.     Reviewed expectations re: course of current medical issues.  Discussed self-management of symptoms.  Outlined signs and symptoms indicating need for more acute intervention.  Patient verbalized understanding and all questions were answered.  Patient received an After-Visit Summary.    No orders of the defined types were placed in this encounter.  Meds ordered this encounter  Medications  . hydrOXYzine (ATARAX/VISTARIL) 25 MG tablet    Sig: Take 0.5-1 tablets (12.5-25 mg total) by mouth 3 (three) times daily as needed.    Dispense:  90 tablet    Refill:  1  . PARoxetine (PAXIL) 20 MG tablet    Sig: Take 1 tablet (20 mg total) by mouth daily.    Dispense:  30 tablet    Refill:  5    May dispense 90 d if pt desires.  Marland Kitchen amLODipine (NORVASC) 10 MG tablet    Sig: Take 1 tablet (10 mg total) by mouth daily.    Dispense:  30 tablet    Refill:  5    May dispense in 90 day if pt desires.  . Desvenlafaxine Succinate ER (PRISTIQ) 25 MG TB24    Sig: 2 tabs PO for 3 days, then 1 tab PO for 1 week, then 1 tab PO QOD for 3  doses.    Dispense:  16 tablet    Refill:  0     Note is dictated utilizing voice recognition software. Although note has been proof read prior to signing, occasional typographical errors still can be missed. If any questions arise, please do not hesitate to call for verification.   electronically signed by:  Howard Pouch, DO  [place]

## 2020-10-11 NOTE — Telephone Encounter (Signed)
Seen encounter 02/04/2021 routing back to myself

## 2020-10-11 NOTE — Patient Instructions (Signed)
Start paxil, vistaril, and taper off pristiq per label instructions.   RESTART AND  NEVER stop your amlodipine. :)

## 2020-10-11 NOTE — Telephone Encounter (Signed)
I can certainly take over those medications for her.  However I do have to see her in order to prescribe medications.  As far as her question with out-of-pocket cost she would need to talk to our office manager, I do not know the specifics on costs.  Thanks

## 2020-10-11 NOTE — Telephone Encounter (Signed)
Noted pt is now in office

## 2020-10-11 NOTE — Telephone Encounter (Signed)
Noted  

## 2020-10-12 ENCOUNTER — Encounter: Payer: Self-pay | Admitting: Family Medicine

## 2020-10-12 NOTE — Telephone Encounter (Signed)
Spoke with pt who stated she is going to pick up rx today.

## 2020-11-07 ENCOUNTER — Encounter: Payer: Self-pay | Admitting: Family Medicine

## 2020-11-10 ENCOUNTER — Ambulatory Visit: Payer: Self-pay | Admitting: Family Medicine

## 2021-03-10 ENCOUNTER — Encounter: Payer: Self-pay | Admitting: Family Medicine

## 2021-03-10 MED ORDER — FLUCONAZOLE 150 MG PO TABS: 150.0000 mg | ORAL_TABLET | Freq: Once | ORAL | 0 refills | Status: AC

## 2021-03-10 NOTE — Telephone Encounter (Signed)
Please inform patient we can call in Diflucan 150 mg tab x1 to the pharmacy she stated in her phone note.  Which is not the pharmacy listed for her (FYI). Also let patient know that in the future I would recommend she try the Monistat 3-day ovule treatment that is over-the-counter.  We typically do not prescribe Diflucan for patients unless I am the one that had prescribed the antibiotics as well.   [place]: Please call that prescription in for her.

## 2021-03-10 NOTE — Addendum Note (Signed)
Addended by: Kavin Leech on: 03/10/2021 03:15 PM   Modules accepted: Orders

## 2021-03-10 NOTE — Telephone Encounter (Signed)
Please advise 

## 2021-03-10 NOTE — Telephone Encounter (Signed)
Note do not see where diflucan has been prescribed in past

## 2021-04-13 ENCOUNTER — Other Ambulatory Visit: Payer: Self-pay

## 2021-04-13 ENCOUNTER — Ambulatory Visit (INDEPENDENT_AMBULATORY_CARE_PROVIDER_SITE_OTHER): Payer: Medicaid Other | Admitting: Family Medicine

## 2021-04-13 ENCOUNTER — Encounter: Payer: Self-pay | Admitting: Family Medicine

## 2021-04-13 VITALS — BP 112/74 | HR 70 | Temp 97.8°F | Ht 62.0 in | Wt 175.0 lb

## 2021-04-13 DIAGNOSIS — E785 Hyperlipidemia, unspecified: Secondary | ICD-10-CM

## 2021-04-13 DIAGNOSIS — F410 Panic disorder [episodic paroxysmal anxiety] without agoraphobia: Secondary | ICD-10-CM

## 2021-04-13 DIAGNOSIS — G479 Sleep disorder, unspecified: Secondary | ICD-10-CM

## 2021-04-13 DIAGNOSIS — F418 Other specified anxiety disorders: Secondary | ICD-10-CM

## 2021-04-13 DIAGNOSIS — E669 Obesity, unspecified: Secondary | ICD-10-CM

## 2021-04-13 DIAGNOSIS — I10 Essential (primary) hypertension: Secondary | ICD-10-CM

## 2021-04-13 DIAGNOSIS — Z79899 Other long term (current) drug therapy: Secondary | ICD-10-CM

## 2021-04-13 MED ORDER — AMLODIPINE BESYLATE 10 MG PO TABS: 10.0000 mg | ORAL_TABLET | Freq: Every day | ORAL | 1 refills | Status: DC

## 2021-04-13 MED ORDER — ATORVASTATIN CALCIUM 20 MG PO TABS: 20.0000 mg | ORAL_TABLET | Freq: Every day | ORAL | 3 refills | Status: DC

## 2021-04-13 MED ORDER — HYDROXYZINE HCL 25 MG PO TABS: 12.5000 mg | ORAL_TABLET | Freq: Three times a day (TID) | ORAL | 1 refills | Status: DC | PRN

## 2021-04-13 MED ORDER — ALPRAZOLAM 0.25 MG PO TABS: 0.2500 mg | ORAL_TABLET | Freq: Every evening | ORAL | 2 refills | Status: DC | PRN

## 2021-04-13 MED ORDER — PAROXETINE HCL 40 MG PO TABS: 40.0000 mg | ORAL_TABLET | Freq: Every day | ORAL | 1 refills | Status: DC

## 2021-04-13 NOTE — Patient Instructions (Signed)
Great to see you today.  I have refilled the medication(s) we provide.   If labs were collected, we will inform you of lab results once received either by echart message or telephone call.   - echart message- for normal results that have been seen by the patient already.   - telephone call: abnormal results or if patient has not viewed results in their echart.  

## 2021-04-13 NOTE — Progress Notes (Signed)
This visit occurred during the SARS-CoV-2 public health emergency.  Safety protocols were in place, including screening questions prior to the visit, additional usage of staff PPE, and extensive cleaning of exam room while observing appropriate contact time as indicated for disinfecting solutions.    Kristine [name] , 1981/03/25, 40 y.o., female MRN: [id number] Patient Care Team    Relationship Specialty Notifications Start End  Ma Hillock, DO PCP - General Family Medicine  04/28/15    Comment: Patient request female provider    Chief Complaint  Patient presents with   Hypertension    [place]; pt is fasting     Subjective: Kristine [name] is a 40 y.o.  Female present for  Hypertension/overweight/HLD:  Pt reports compliance with amlodipine 10 mg. She no longer has a job and has lost her insurance. Patient denies chest pain, shortness of breath, dizziness or lower extremity edema.  Diet: not routinely.  RF: HTN, HLD, obesity ROS: See pertinent positives and negatives per HPI.  Depression with anxiety/sleep disturbance:  Pt reports she is still having stress and depressive symptoms. She feels better since off prsitiq and starting paxil and vistaril. She is asking for refills on xanax today. Last refill was 04/08/2020 #30. Rarely uses.  Prior note: In the past she has been prescribed buspar and paxil. Buspar was not helpful- but she did well with paxil. She had not been prescribed meds in sometime (2017).  She had been attending therapy sessions and her therapist reccommended she see a psychiatrist. The psychiatrist started her on pristiq 100, wellbutrin 150 added later, then seroquel and xanax. She reports the majority of her symptoms are related to anxiety, but she endorses depression as well. She has panic attacks at times and uses the xanax prescribed by psych at those times (rarely needs. She endorses headache and shakiness yesterday- she assumed it was from abruptly stopping  pristiq.   Depression screen [facility] 2/9 10/11/2020 10/18/2018 12/14/2017 05/28/2017  Decreased Interest 0 0 0 0  Down, Depressed, Hopeless 2 0 0 0  PHQ - 2 Score 2 0 0 0  Altered sleeping 3 - 0 -  Tired, decreased energy 3 - 0 -  Change in appetite 3 - 0 -  Feeling bad or failure about yourself  3 - 0 -  Trouble concentrating 3 - 0 -  Moving slowly or fidgety/restless 1 - 0 -  Suicidal thoughts 1 - 0 -  PHQ-9 Score 19 - 0 -    Allergies  Allergen Reactions   Shellfish Allergy Anaphylaxis   Social History   Social History Narrative   Kristine [name] lives with her son in [place]. 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   Past Medical History:  Diagnosis Date   Chicken pox    Frequent headaches    Hyperhidrosis    Hyperlipidemia    Hypertension    Past Surgical History:  Procedure Laterality Date   CESAREAN SECTION  2001   Family History  Problem Relation Age of Onset   Hypertension Mother    Celiac disease Son    Allergies as of 04/13/2021       Reactions   Shellfish Allergy Anaphylaxis        Medication List        Accurate as of April 13, 2021  8:40 AM. If you have any questions, ask your nurse or doctor.          STOP taking these medications  Desvenlafaxine Succinate ER 25 MG Tb24 Commonly known as: Pristiq Stopped by: Howard Pouch, DO       TAKE these medications    ALPRAZolam 0.25 MG tablet Commonly known as: XANAX Take 1 tablet (0.25 mg total) by mouth at bedtime as needed for anxiety.   amLODipine 10 MG tablet Commonly known as: NORVASC Take 1 tablet (10 mg total) by mouth daily.   atorvastatin 20 MG tablet Commonly known as: LIPITOR Take 1 tablet (20 mg total) by mouth daily.   hydrOXYzine 25 MG tablet Commonly known as: ATARAX/VISTARIL Take 0.5-1 tablets (12.5-25 mg total) by mouth 3 (three) times daily as needed.   Iron 325 (65 Fe) MG Tabs Take by mouth. 3x a week   MULTI-VITAMIN DAILY PO Take 1 mg by mouth.    PARoxetine 40 MG tablet Commonly known as: Paxil Take 1 tablet (40 mg total) by mouth daily. What changed:  medication strength how much to take Changed by: Howard Pouch, DO   Vitamin D (Cholecalciferol) 25 MCG (1000 UT) Tabs Take 2 tablets by mouth daily.        All past medical history, surgical history, allergies, family history, immunizations andmedications were updated in the EMR today and reviewed under the history and medication portions of their EMR.     ROS: Negative, with the exception of above mentioned in HPI   Objective:  BP 112/74   Pulse 70   Temp 97.8 F (36.6 C) (Oral)   Ht 5\' 2"  (1.575 m)   Wt 175 lb (79.4 kg)   SpO2 98%   BMI 32.01 kg/m  Body mass index is 32.01 kg/m. Gen: Afebrile. No acute distress. Nontoxic, pleasant obese female.  HENT: AT. [place]. MMM Eyes:Pupils Equal Round Reactive to light, Extraocular movements intact,  Conjunctiva without redness, discharge or icterus. CV: RRR no murmur, no edema Chest: CTAB, no wheeze or crackles Skin: no rashes, purpura or petechiae.  Neuro: Normal gait. PERLA. EOMi. Alert. Oriented x3 Psych: Normal affect, dress and demeanor. Normal speech. Normal thought content and judgment.   No results found. No results found. No results found for this or any previous visit (from the past 24 hour(s)).  Assessment/Plan: [name] is a 40 y.o. female present for OV for  Essential hypertension/Hyperlipidemia LDL goal <130/on statin/obesity - stable.  Continue  amlodipine 10 mg QD.  - printed lipitor script - she will look on good rx.  - continue exercise and low salt diet.  - F/U 5.5 mos.  Anxiety with depression/Panic attacks/sleep disturbance Better, but could use more coverage.  - increase Paxil to 40 mg daily - continue  Vistaril 12.5-25 mg nightly, and can use a half a tab for panic if necessary. - continue xanax prn- rarely needs. Refills provided today. [place] reviewed.  Tried meds: BuSpar-  did not work well.  Paxil-worked well.  Pristiq-did not seem to work well since needing to continue to increase dose and add other meds.  Wellbutrin-may have worked okay but ran out.  Xanax-rarely needs.  Seroquel-madetoo sleepy even on 12.5 mg F/u 5.5 mos- sooner if needed.    Reviewed expectations re: course of current medical issues. Discussed self-management of symptoms. Outlined signs and symptoms indicating need for more acute intervention. Patient verbalized understanding and all questions were answered. Patient received an After-Visit Summary.    No orders of the defined types were placed in this encounter.  Meds ordered this encounter  Medications   amLODipine (NORVASC) 10 MG tablet  Sig: Take 1 tablet (10 mg total) by mouth daily.    Dispense:  90 tablet    Refill:  1   atorvastatin (LIPITOR) 20 MG tablet    Sig: Take 1 tablet (20 mg total) by mouth daily.    Dispense:  90 tablet    Refill:  3   hydrOXYzine (ATARAX/VISTARIL) 25 MG tablet    Sig: Take 0.5-1 tablets (12.5-25 mg total) by mouth 3 (three) times daily as needed.    Dispense:  90 tablet    Refill:  1   PARoxetine (PAXIL) 40 MG tablet    Sig: Take 1 tablet (40 mg total) by mouth daily.    Dispense:  90 tablet    Refill:  1   ALPRAZolam (XANAX) 0.25 MG tablet    Sig: Take 1 tablet (0.25 mg total) by mouth at bedtime as needed for anxiety.    Dispense:  30 tablet    Refill:  2     Note is dictated utilizing voice recognition software. Although note has been proof read prior to signing, occasional typographical errors still can be missed. If any questions arise, please do not hesitate to call for verification.   electronically signed by:  Howard Pouch, DO  [place]

## 2021-08-10 ENCOUNTER — Other Ambulatory Visit: Payer: Self-pay

## 2021-08-10 ENCOUNTER — Other Ambulatory Visit: Payer: Self-pay | Admitting: Family Medicine

## 2021-08-10 ENCOUNTER — Ambulatory Visit (INDEPENDENT_AMBULATORY_CARE_PROVIDER_SITE_OTHER): Payer: Self-pay | Admitting: Family Medicine

## 2021-08-10 ENCOUNTER — Encounter: Payer: Self-pay | Admitting: Family Medicine

## 2021-08-10 VITALS — BP 125/82 | HR 86 | Temp 98.9°F | Ht 62.0 in | Wt 173.0 lb

## 2021-08-10 DIAGNOSIS — T7421XA Adult sexual abuse, confirmed, initial encounter: Secondary | ICD-10-CM

## 2021-08-10 DIAGNOSIS — R102 Pelvic and perineal pain: Secondary | ICD-10-CM

## 2021-08-10 DIAGNOSIS — N898 Other specified noninflammatory disorders of vagina: Secondary | ICD-10-CM

## 2021-08-10 MED ORDER — METRONIDAZOLE 500 MG PO TABS: 500.0000 mg | ORAL_TABLET | Freq: Three times a day (TID) | ORAL | 0 refills | Status: AC

## 2021-08-10 MED ORDER — CEFTRIAXONE SODIUM 500 MG IJ SOLR
500.0000 mg | Freq: Once | INTRAMUSCULAR | Status: AC
  Administered 2021-08-10: 12:00:00 500 mg via INTRAMUSCULAR

## 2021-08-10 MED ORDER — FLUCONAZOLE 150 MG PO TABS: 150.0000 mg | ORAL_TABLET | Freq: Once | ORAL | 0 refills | Status: AC

## 2021-08-10 MED ORDER — DOXYCYCLINE HYCLATE 100 MG PO TABS: 100.0000 mg | ORAL_TABLET | Freq: Two times a day (BID) | ORAL | 0 refills | Status: DC
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
## 2021-08-10 NOTE — Patient Instructions (Addendum)
°  Please check in to [facility] as soon as possible.  Start prescribed meds asap.  Take WITH food. But, avoid all alcohol and diary while taking meds.    If you need anything please let us know.

## 2021-08-15 ENCOUNTER — Telehealth: Payer: Self-pay | Admitting: Family Medicine

## 2021-08-15 ENCOUNTER — Encounter: Payer: Self-pay | Admitting: Family Medicine

## 2021-08-15 LAB — RPR
RPR Ser Ql: NONREACTIVE
RPR Ser Ql: NONREACTIVE

## 2021-08-15 LAB — HEPATITIS PANEL, ACUTE
Hep A IgM: NONREACTIVE
Hep A IgM: NONREACTIVE
Hep B C IgM: NONREACTIVE
Hep B C IgM: NONREACTIVE
Hepatitis B Surface Ag: NONREACTIVE
Hepatitis B Surface Ag: NONREACTIVE
Hepatitis C Ab: NONREACTIVE
Hepatitis C Ab: NONREACTIVE
SIGNAL TO CUT-OFF: 0.05 (ref <1.00)
SIGNAL TO CUT-OFF: 0.07 (ref <1.00)

## 2021-08-15 LAB — HSV(HERPES SIMPLEX VRS) I + II AB-IGG
HAV 1 IGG,TYPE SPECIFIC AB: <0.9 index
HAV 1 IGG,TYPE SPECIFIC AB: <0.9 index
HSV 2 IGG,TYPE SPECIFIC AB: 18.2 index — ABNORMAL HIGH
HSV 2 IGG,TYPE SPECIFIC AB: 22 index — ABNORMAL HIGH

## 2021-08-15 LAB — HSV 1/2 AB (IGM), IFA W/RFLX TITER
HSV 1 IgM Screen: NEGATIVE
HSV 1 IgM Screen: NEGATIVE
HSV 2 IgM Screen: NEGATIVE
HSV 2 IgM Screen: NEGATIVE

## 2021-08-15 LAB — HIV ANTIBODY (ROUTINE TESTING W REFLEX): HIV 1&2 Ab, 4th Generation: NONREACTIVE

## 2021-08-15 LAB — HCG, SERUM, QUALITATIVE
Preg, Serum: NEGATIVE
Preg, Serum: NEGATIVE

## 2021-08-15 NOTE — Telephone Encounter (Signed)
Spoke with patient regarding results/recommendations.  

## 2021-08-15 NOTE — Telephone Encounter (Signed)
Spoke with pt about results

## 2021-08-15 NOTE — Telephone Encounter (Signed)
Please call patient HIV ab negative Hep panel neg RPR/syphilis negative  She was treated presumptively for gonorrhea, chlamydia and trichomonas given the circumstances.    - lab appt only for urine cytology in 3-4 weeks. Orders have been placed. This will test for G/C/T to ensure negative.    - I would recommend she have a repeat HIV test in 90 days. It can take the body about 90 days to make the antibodies. She can elect to have this here or at the health dept (which would be cheaper/maybe free)?     The one lab for  HSV is still pending. 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

## 2021-08-19 ENCOUNTER — Ambulatory Visit: Payer: Medicaid Other | Admitting: Family Medicine

## 2021-08-31 ENCOUNTER — Ambulatory Visit: Payer: Medicaid Other

## 2021-08-31 ENCOUNTER — Other Ambulatory Visit: Payer: Medicaid Other

## 2021-09-21 ENCOUNTER — Other Ambulatory Visit: Payer: Self-pay

## 2021-09-21 ENCOUNTER — Ambulatory Visit (INDEPENDENT_AMBULATORY_CARE_PROVIDER_SITE_OTHER): Payer: Medicaid Other | Admitting: Family Medicine

## 2021-09-21 ENCOUNTER — Other Ambulatory Visit (HOSPITAL_COMMUNITY)
Admission: RE | Admit: 2021-09-21 | Discharge: 2021-09-21 | Disposition: A | Discharge status: Home / Self Care | Payer: Medicaid Other | Source: Ambulatory Visit | Attending: Family Medicine | Admitting: Family Medicine

## 2021-09-21 ENCOUNTER — Encounter: Payer: Self-pay | Admitting: Family Medicine

## 2021-09-21 ENCOUNTER — Telehealth: Payer: Self-pay | Admitting: Family Medicine

## 2021-09-21 VITALS — BP 129/75 | HR 81 | Temp 98.1°F | Ht 62.0 in | Wt 174.0 lb

## 2021-09-21 DIAGNOSIS — Z79899 Other long term (current) drug therapy: Secondary | ICD-10-CM

## 2021-09-21 DIAGNOSIS — Z113 Encounter for screening for infections with a predominantly sexual mode of transmission: Secondary | ICD-10-CM | POA: Insufficient documentation

## 2021-09-21 DIAGNOSIS — E669 Obesity, unspecified: Secondary | ICD-10-CM

## 2021-09-21 DIAGNOSIS — F418 Other specified anxiety disorders: Secondary | ICD-10-CM

## 2021-09-21 DIAGNOSIS — I10 Essential (primary) hypertension: Secondary | ICD-10-CM

## 2021-09-21 DIAGNOSIS — F410 Panic disorder [episodic paroxysmal anxiety] without agoraphobia: Secondary | ICD-10-CM

## 2021-09-21 DIAGNOSIS — E785 Hyperlipidemia, unspecified: Secondary | ICD-10-CM

## 2021-09-21 DIAGNOSIS — G479 Sleep disorder, unspecified: Secondary | ICD-10-CM

## 2021-09-21 DIAGNOSIS — N912 Amenorrhea, unspecified: Secondary | ICD-10-CM

## 2021-09-21 LAB — POCT URINE PREGNANCY: Preg Test, Ur: NEGATIVE

## 2021-09-21 MED ORDER — AMLODIPINE BESYLATE 10 MG PO TABS: 10.0000 mg | ORAL_TABLET | Freq: Every day | ORAL | 1 refills | Status: DC

## 2021-09-21 MED ORDER — ALPRAZOLAM 0.25 MG PO TABS: 0.2500 mg | ORAL_TABLET | Freq: Every evening | ORAL | 2 refills | Status: DC | PRN

## 2021-09-21 MED ORDER — PAROXETINE HCL 40 MG PO TABS: 40.0000 mg | ORAL_TABLET | Freq: Every day | ORAL | 1 refills | Status: DC

## 2021-09-21 NOTE — Patient Instructions (Signed)
Great to see you today.  I have refilled the medication(s) we provide.   If labs were collected, we will inform you of lab results once received either by echart message or telephone call.   - echart message- for normal results that have been seen by the patient already.   - telephone call: abnormal results or if patient has not viewed results in their echart.  

## 2021-09-21 NOTE — Telephone Encounter (Signed)
Pt informed of results.
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
## 2021-09-21 NOTE — Telephone Encounter (Signed)
Please inform patient the following information: ?Negative preg test ? ?

## 2021-09-23 LAB — URINE CYTOLOGY ANCILLARY ONLY
Chlamydia: NEGATIVE
Comment: NEGATIVE
Comment: NEGATIVE
Comment: NORMAL
Neisseria Gonorrhea: NEGATIVE
Trichomonas: NEGATIVE

## 2021-09-28 ENCOUNTER — Ambulatory Visit: Payer: Medicaid Other | Admitting: Family Medicine

## 2022-03-08 ENCOUNTER — Ambulatory Visit (INDEPENDENT_AMBULATORY_CARE_PROVIDER_SITE_OTHER): Payer: Medicaid Other | Admitting: Family Medicine

## 2022-03-08 ENCOUNTER — Telehealth: Payer: Self-pay | Admitting: Family Medicine

## 2022-03-08 ENCOUNTER — Encounter: Payer: Self-pay | Admitting: Family Medicine

## 2022-03-08 VITALS — BP 116/82 | HR 72 | Temp 98.0°F | Ht 62.0 in | Wt 173.0 lb

## 2022-03-08 DIAGNOSIS — F418 Other specified anxiety disorders: Secondary | ICD-10-CM

## 2022-03-08 DIAGNOSIS — E785 Hyperlipidemia, unspecified: Secondary | ICD-10-CM

## 2022-03-08 DIAGNOSIS — Z0000 Encounter for general adult medical examination without abnormal findings: Secondary | ICD-10-CM

## 2022-03-08 DIAGNOSIS — Z79899 Other long term (current) drug therapy: Secondary | ICD-10-CM

## 2022-03-08 DIAGNOSIS — I10 Essential (primary) hypertension: Secondary | ICD-10-CM

## 2022-03-08 DIAGNOSIS — Z1231 Encounter for screening mammogram for malignant neoplasm of breast: Secondary | ICD-10-CM

## 2022-03-08 DIAGNOSIS — F410 Panic disorder [episodic paroxysmal anxiety] without agoraphobia: Secondary | ICD-10-CM

## 2022-03-08 DIAGNOSIS — E559 Vitamin D deficiency, unspecified: Secondary | ICD-10-CM

## 2022-03-08 LAB — COMPREHENSIVE METABOLIC PANEL
ALT: 14 U/L (ref 0–35)
AST: 16 U/L (ref 0–37)
Albumin: 4.4 g/dL (ref 3.5–5.2)
Alkaline Phosphatase: 78 U/L (ref 39–117)
BUN: 11 mg/dL (ref 6–23)
CO2: 23 mEq/L (ref 19–32)
Calcium: 9.5 mg/dL (ref 8.4–10.5)
Chloride: 102 mEq/L (ref 96–112)
Creatinine, Ser: 0.68 mg/dL (ref 0.40–1.20)
GFR: 108.89 mL/min (ref >60.00)
Glucose, Bld: 99 mg/dL (ref 70–99)
Potassium: 4.2 mEq/L (ref 3.5–5.1)
Sodium: 135 mEq/L (ref 135–145)
Total Bilirubin: 0.3 mg/dL (ref 0.2–1.2)
Total Protein: 8 g/dL (ref 6.0–8.3)

## 2022-03-08 LAB — LDL CHOLESTEROL, DIRECT: Direct LDL: 187 mg/dL

## 2022-03-08 MED ORDER — ATORVASTATIN CALCIUM 20 MG PO TABS: 20.0000 mg | ORAL_TABLET | Freq: Every evening | ORAL | 3 refills | Status: DC

## 2022-03-08 MED ORDER — AMLODIPINE BESYLATE 10 MG PO TABS: 10.0000 mg | ORAL_TABLET | Freq: Every day | ORAL | 1 refills | Status: DC

## 2022-03-08 MED ORDER — ALPRAZOLAM 0.25 MG PO TABS
0.2500 mg | ORAL_TABLET | Freq: Every evening | ORAL | 2 refills | Status: DC | PRN
Start: 2022-03-08 — End: 2022-10-10

## 2022-03-08 MED ORDER — PAROXETINE HCL 20 MG PO TABS: 20.0000 mg | ORAL_TABLET | Freq: Every day | ORAL | 1 refills | Status: DC

## 2022-03-08 NOTE — Progress Notes (Signed)
Patient ID: Kristine [name], female  DOB: 1980/08/31, 41 y.o.   MRN: [id number] Patient Care Team    Relationship Specialty Notifications Start End  Ma Hillock, DO PCP - General Family Medicine  04/28/15    Comment: Patient request female provider    Chief Complaint  Patient presents with   Annual Exam    Pt is fasting    Subjective: Kristine [name] is a 41 y.o.  Female  present for CPE All past medical history, surgical history, allergies, family history, immunizations, medications and social history were updated in the electronic medical record today. All recent labs, ED visits and hospitalizations within the last year were reviewed.  Health maintenance:  Colonoscopy: No family history.  Routine screen 46  Mammogram: No family history routine screening at 41 > placed today.  Provided scholarship form. Cervical cancer screening: last pap: 2016., results: Normal, completed by: PCP- declined today Immunizations: tdap up-to-date 2022, Influenza  (encouraged yearly). Infectious disease screening: HIV completed, Hep C completed DEXA: Routine screening Assistive device: None Oxygen use: None Patient has a Dental home. Hospitalizations/ED visits: Reviewed  Hypertension/overweight/HLD:  Pt reports compliance with amlodipine 10 mg. Patient denies chest pain, shortness of breath, dizziness or lower extremity edema.  Diet: not routinely.  RF: HTN, HLD, obesity ROS: See pertinent positives and negatives per HPI.  Depression with anxiety/sleep disturbance:  She feels paxil is working well, but wants to decrease dose to day.  She uses xanax as needed. She has not needed the vistaril .  xanax>. Rarely uses.  Prior note: In the past she has been prescribed buspar and paxil. Buspar was not helpful- but she did well with paxil. She had not been prescribed meds in sometime (2017).  She had been attending therapy sessions and her therapist reccommended she see a psychiatrist. The  psychiatrist started her on pristiq 100, wellbutrin 150 added later, then seroquel and xanax. She reports the majority of her symptoms are related to anxiety, but she endorses depression as well. She has panic attacks at times and uses the xanax prescribed by psych at those times (rarely needs. She endorses headache and shakiness yesterday- she assumed it was from abruptly stopping pristiq.      09/21/2021    2:04 PM 08/10/2021   10:26 AM 04/13/2021    8:43 AM 10/11/2020    3:08 PM 10/18/2018    8:47 AM  Depression screen PHQ 2/9  Decreased Interest 0 0 1 0 0  Down, Depressed, Hopeless 0 0 2 2 0  PHQ - 2 Score 0 0 3 2 0  Altered sleeping _0 Tired, decreased energy _1 Change in appetite _2 Feeling bad or failure about yourself  0  2 3   Trouble concentrating 0  1 3   Moving slowly or fidgety/restless 0  0 1   Suicidal thoughts 0  1 1   PHQ-9 Score _3 09/21/2021    2:06 PM 04/13/2021    8:44 AM 10/11/2020    3:09 PM  GAD 7 : Generalized Anxiety Score  Nervous, Anxious, on Edge _4 Control/stop worrying _5 Worry too much - different things _6 Trouble relaxing 0 3 1  Restless 0 1 1  Easily annoyed or irritable 0 1 2  Afraid - awful might happen 0 2  3  Total GAD 7 Score _0 Immunization History  Administered Date(s) Administered   Influenza Inj Mdck Quad Pf 04/13/2017   Influenza,inj,Quad PF,6+ Mos 05/02/2019   Influenza-Unspecified 04/17/2015, 04/16/2016, 04/05/2017   Tdap 07/21/2013, 03/02/2021     Past Medical History:  Diagnosis Date   Chicken pox    Frequent headaches    Hyperhidrosis    Hyperlipidemia    Hypertension    Allergies  Allergen Reactions   Shellfish Allergy Anaphylaxis   Past Surgical History:  Procedure Laterality Date   CESAREAN SECTION  2001   Family History  Problem Relation Age of Onset   Hypertension Mother    Celiac disease Son    Social History   Social History Narrative   Ms.  [name] lives with her son in [place]. 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    Allergies as of 03/08/2022       Reactions   Shellfish Allergy Anaphylaxis        Medication List        Accurate as of March 08, 2022  8:41 AM. If you have any questions, ask your nurse or doctor.          ALPRAZolam 0.25 MG tablet Commonly known as: XANAX Take 1 tablet (0.25 mg total) by mouth at bedtime as needed for anxiety.   amLODipine 10 MG tablet Commonly known as: NORVASC Take 1 tablet (10 mg total) by mouth daily.   PARoxetine 20 MG tablet Commonly known as: PAXIL Take 1 tablet (20 mg total) by mouth daily. What changed:  medication strength how much to take Changed by: Howard Pouch, DO        All past medical history, surgical history, allergies, family history, immunizations andmedications were updated in the EMR today and reviewed under the history and medication portions of their EMR.     No results found for this or any previous visit (from the past 2160 hour(s)).  No results found.   ROS 14 pt review of systems performed and negative (unless mentioned in an HPI)  Objective: BP 116/82   Pulse 72   Temp 98 F (36.7 C) (Oral)   Ht 5' 2" (1.575 m)   Wt 173 lb (78.5 kg)   LMP 02/20/2022   SpO2 99%   BMI 31.64 kg/m  Physical Exam Vitals and nursing note reviewed.  Constitutional:      General: She is not in acute distress.    Appearance: Normal appearance. She is not ill-appearing or toxic-appearing.  HENT:     Head: Normocephalic and atraumatic.     Right Ear: Tympanic membrane, ear canal and external ear normal. There is no impacted cerumen.     Left Ear: Tympanic membrane, ear canal and external ear normal. There is no impacted cerumen.     Nose: No congestion or rhinorrhea.     Mouth/Throat:     Mouth: Mucous membranes are moist.     Pharynx: Oropharynx is clear. No oropharyngeal exudate or posterior oropharyngeal erythema.  Eyes:      General: No scleral icterus.       Right eye: No discharge.        Left eye: No discharge.     Extraocular Movements: Extraocular movements intact.     Conjunctiva/sclera: Conjunctivae normal.     Pupils: Pupils are equal, round, and reactive to light.  Cardiovascular:     Rate and  Rhythm: Normal rate and regular rhythm.     Pulses: Normal pulses.     Heart sounds: Normal heart sounds. No murmur heard.    No friction rub. No gallop.  Pulmonary:     Effort: Pulmonary effort is normal. No respiratory distress.     Breath sounds: Normal breath sounds. No stridor. No wheezing, rhonchi or rales.  Chest:     Chest wall: No tenderness.  Abdominal:     General: Abdomen is flat. Bowel sounds are normal. There is no distension.     Palpations: Abdomen is soft. There is no mass.     Tenderness: There is no abdominal tenderness. There is no right CVA tenderness, left CVA tenderness, guarding or rebound.     Hernia: No hernia is present.  Musculoskeletal:        General: No swelling, tenderness or deformity. Normal range of motion.     Cervical back: Normal range of motion and neck supple. No rigidity or tenderness.     Right lower leg: No edema.     Left lower leg: No edema.  Lymphadenopathy:     Cervical: No cervical adenopathy.  Skin:    General: Skin is warm and dry.     Coloration: Skin is not jaundiced or pale.     Findings: No bruising, erythema, lesion or rash.  Neurological:     General: No focal deficit present.     Mental Status: She is alert and oriented to person, place, and time. Mental status is at baseline.     Cranial Nerves: No cranial nerve deficit.     Sensory: No sensory deficit.     Motor: No weakness.     Coordination: Coordination normal.     Gait: Gait normal.     Deep Tendon Reflexes: Reflexes normal.  Psychiatric:        Mood and Affect: Mood normal.        Behavior: Behavior normal.        Thought Content: Thought content normal.        Judgment: Judgment  normal.        No results found.  Assessment/plan: JAVEAH LOEZA is a 41 y.o. female present for CPE Essential hypertension/Hyperlipidemia LDL goal <130/on statin/obesity Stable Continue amlodipine 10 mg QD.  Not taking lipitor - continue exercise and low salt diet.  - F/U 5.5 mos.  Anxiety with depression/Panic attacks/sleep disturbance Stable Continue Paxil to 20 mg daily (lower dose) Continue xanax prn- rarely needs. Refills provided today. [place] reviewed.  Tried meds: BuSpar- did not work well.  Paxil-worked well.  Pristiq-did not seem to work well since needing to continue to increase dose and add other meds.  Wellbutrin-may have worked okay but ran out.  Xanax-rarely needs.  Seroquel-madetoo sleepy even on 12.5 mg F/u 5.5 mos- sooner if needed.   Encounter for screening mammogram for malignant neoplasm of breast - MM 3D SCREEN BREAST BILATERAL; Future  Routine general medical examination at a health care facility Colonoscopy: No family history.  Routine screen 46  Mammogram: No family history routine screening at 41 > placed today.  Provided scholarship form. Cervical cancer screening: last pap: 2016., results: Normal, completed by: PCP- declined today Immunizations: tdap up-to-date 2022, Influenza  (encouraged yearly). Infectious disease screening: HIV completed, Hep C completed DEXA: Routine screening Patient was encouraged to exercise greater than 150 minutes a week. Patient was encouraged to choose a diet filled with fresh fruits and vegetables, and lean meats. AVS  provided to patient today for education/recommendation on gender specific health and safety maintenance. Return in about 24 weeks (around 08/23/2022) for Routine chronic condition follow-up.  Orders Placed This Encounter  Procedures   MM 3D SCREEN BREAST BILATERAL   Comp Met (CMET)   Direct LDL    Orders Placed This Encounter  Procedures   MM 3D SCREEN BREAST BILATERAL   Comp Met (CMET)    Direct LDL   Meds ordered this encounter  Medications   amLODipine (NORVASC) 10 MG tablet    Sig: Take 1 tablet (10 mg total) by mouth daily.    Dispense:  90 tablet    Refill:  1   PARoxetine (PAXIL) 20 MG tablet    Sig: Take 1 tablet (20 mg total) by mouth daily.    Dispense:  90 tablet    Refill:  1   ALPRAZolam (XANAX) 0.25 MG tablet    Sig: Take 1 tablet (0.25 mg total) by mouth at bedtime as needed for anxiety.    Dispense:  30 tablet    Refill:  2   Referral Orders  No referral(s) requested today     Electronically signed by: Renee Kuneff, DO [facility] 

## 2022-03-08 NOTE — Telephone Encounter (Signed)
Please inform patient her liver function, kidney function and electrolytes are normal. Her LDL/bad cholesterol is extremely high at 187.  Her LDL goal should be 100 or less.  Cholesterol at this level is high risk for heart attack and stroke within the next 10 years. I have called in the Lipitor she had been on prior.  Take 1 tab before bed or in the evening.

## 2022-03-08 NOTE — Patient Instructions (Signed)
No follow-ups on file.        Great to see you today.  I have refilled the medication(s) we provide.   If labs were collected, we will inform you of lab results once received either by echart message or telephone call.   - echart message- for normal results that have been seen by the patient already.   - telephone call: abnormal results or if patient has not viewed results in their echart.  Health Maintenance, Female Adopting a healthy lifestyle and getting preventive care are important in promoting health and wellness. Ask your health care provider about: The right schedule for you to have regular tests and exams. Things you can do on your own to prevent diseases and keep yourself healthy. What should I know about diet, weight, and exercise? Eat a healthy diet  Eat a diet that includes plenty of vegetables, fruits, low-fat dairy products, and lean protein. Do not eat a lot of foods that are high in solid fats, added sugars, or sodium. Maintain a healthy weight Body mass index (BMI) is used to identify weight problems. It estimates body fat based on height and weight. Your health care provider can help determine your BMI and help you achieve or maintain a healthy weight. Get regular exercise Get regular exercise. This is one of the most important things you can do for your health. Most adults should: Exercise for at least 150 minutes each week. The exercise should increase your heart rate and make you sweat (moderate-intensity exercise). Do strengthening exercises at least twice a week. This is in addition to the moderate-intensity exercise. Spend less time sitting. Even light physical activity can be beneficial. Watch cholesterol and blood lipids Have your blood tested for lipids and cholesterol at 40 years of age, then have this test every 5 years. Have your cholesterol levels checked more often if: Your lipid or cholesterol levels are high. You are older than 40 years of  age. You are at high risk for heart disease. What should I know about cancer screening? Depending on your health history and family history, you may need to have cancer screening at various ages. This may include screening for: Breast cancer. Cervical cancer. Colorectal cancer. Skin cancer. Lung cancer. What should I know about heart disease, diabetes, and high blood pressure? Blood pressure and heart disease High blood pressure causes heart disease and increases the risk of stroke. This is more likely to develop in people who have high blood pressure readings or are overweight. Have your blood pressure checked: Every 3-5 years if you are 18-39 years of age. Every year if you are 40 years old or older. Diabetes Have regular diabetes screenings. This checks your fasting blood sugar level. Have the screening done: Once every three years after age 40 if you are at a normal weight and have a low risk for diabetes. More often and at a younger age if you are overweight or have a high risk for diabetes. What should I know about preventing infection? Hepatitis B If you have a higher risk for hepatitis B, you should be screened for this virus. Talk with your health care provider to find out if you are at risk for hepatitis B infection. Hepatitis C Testing is recommended for: Everyone born from 1945 through 1965. Anyone with known risk factors for hepatitis C. Sexually transmitted infections (STIs) Get screened for STIs, including gonorrhea and chlamydia, if: You are sexually active and are younger than 40 years of age. You are   older than 40 years of age and your health care provider tells you that you are at risk for this type of infection. Your sexual activity has changed since you were last screened, and you are at increased risk for chlamydia or gonorrhea. Ask your health care provider if you are at risk. Ask your health care provider about whether you are at high risk for HIV. Your health  care provider may recommend a prescription medicine to help prevent HIV infection. If you choose to take medicine to prevent HIV, you should first get tested for HIV. You should then be tested every 3 months for as long as you are taking the medicine. Pregnancy If you are about to stop having your period (premenopausal) and you may become pregnant, seek counseling before you get pregnant. Take 400 to 800 micrograms (mcg) of folic acid every day if you become pregnant. Ask for birth control (contraception) if you want to prevent pregnancy. Osteoporosis and menopause Osteoporosis is a disease in which the bones lose minerals and strength with aging. This can result in bone fractures. If you are 65 years old or older, or if you are at risk for osteoporosis and fractures, ask your health care provider if you should: Be screened for bone loss. Take a calcium or vitamin D supplement to lower your risk of fractures. Be given hormone replacement therapy (HRT) to treat symptoms of menopause. Follow these instructions at home: Alcohol use Do not drink alcohol if: Your health care provider tells you not to drink. You are pregnant, may be pregnant, or are planning to become pregnant. If you drink alcohol: Limit how much you have to: 0-1 drink a day. Know how much alcohol is in your drink. In the [place] one drink equals one 12 oz bottle of beer (355 mL), one 5 oz glass of wine (148 mL), or one 1 oz glass of hard liquor (44 mL). Lifestyle Do not use any products that contain nicotine or tobacco. These products include cigarettes, chewing tobacco, and vaping devices, such as e-cigarettes. If you need help quitting, ask your health care provider. Do not use street drugs. Do not share needles. Ask your health care provider for help if you need support or information about quitting drugs. General instructions Schedule regular health, dental, and eye exams. Stay current with your vaccines. Tell your health  care provider if: You often feel depressed. You have ever been abused or do not feel safe at home. Summary Adopting a healthy lifestyle and getting preventive care are important in promoting health and wellness. Follow your health care provider's instructions about healthy diet, exercising, and getting tested or screened for diseases. Follow your health care provider's instructions on monitoring your cholesterol and blood pressure. This information is not intended to replace advice given to you by your health care provider. Make sure you discuss any questions you have with your health care provider. Document Revised: 11/22/2020 Document Reviewed: 11/22/2020 Elsevier Patient Education  2023 Elsevier Inc.  

## 2022-03-09 ENCOUNTER — Encounter: Payer: Self-pay | Admitting: Family Medicine

## 2022-03-09 ENCOUNTER — Telehealth: Payer: Self-pay | Admitting: Family Medicine

## 2022-03-09 NOTE — Telephone Encounter (Signed)
LVM for pt to CB regarding results.  

## 2022-03-09 NOTE — Telephone Encounter (Signed)
See other encounter.

## 2022-03-09 NOTE — Telephone Encounter (Signed)
Pt responded via my chart

## 2022-03-09 NOTE — Telephone Encounter (Signed)
Kristine [name] E 46 minutes ago (11:10 AM)   IM Pt called back about results.

## 2022-03-09 NOTE — Telephone Encounter (Signed)
Pt called back about results.

## 2022-04-10 ENCOUNTER — Ambulatory Visit: Payer: Medicaid Other | Admitting: Family Medicine

## 2022-04-13 ENCOUNTER — Ambulatory Visit
Admission: RE | Admit: 2022-04-13 | Discharge: 2022-04-13 | Disposition: A | Discharge status: Home / Self Care | Payer: No Typology Code available for payment source | Source: Ambulatory Visit | Attending: Family Medicine | Admitting: Family Medicine

## 2022-04-13 DIAGNOSIS — Z1231 Encounter for screening mammogram for malignant neoplasm of breast: Secondary | ICD-10-CM

## 2022-04-24 ENCOUNTER — Ambulatory Visit: Payer: Medicaid Other | Admitting: Family Medicine

## 2022-05-02 ENCOUNTER — Other Ambulatory Visit (HOSPITAL_COMMUNITY)
Admission: RE | Admit: 2022-05-02 | Discharge: 2022-05-02 | Disposition: A | Discharge status: Home / Self Care | Payer: Medicaid Other | Source: Ambulatory Visit | Attending: Family Medicine | Admitting: Family Medicine

## 2022-05-02 ENCOUNTER — Ambulatory Visit: Payer: Medicaid Other | Admitting: Family Medicine

## 2022-05-02 ENCOUNTER — Encounter: Payer: Self-pay | Admitting: Family Medicine

## 2022-05-02 VITALS — BP 130/89 | HR 87 | Temp 98.4°F | Wt 167.4 lb

## 2022-05-02 DIAGNOSIS — Z01419 Encounter for gynecological examination (general) (routine) without abnormal findings: Secondary | ICD-10-CM | POA: Diagnosis present

## 2022-05-02 MED ORDER — PAROXETINE HCL 20 MG PO TABS: 20.0000 mg | ORAL_TABLET | Freq: Every day | ORAL | 0 refills | Status: DC

## 2022-05-02 MED ORDER — AMLODIPINE BESYLATE 10 MG PO TABS: 10.0000 mg | ORAL_TABLET | Freq: Every day | ORAL | 0 refills | Status: DC

## 2022-05-02 NOTE — Patient Instructions (Addendum)
Return in about 24 weeks (around 10/17/2022) for Routine chronic condition follow-up.        Great to see you today.  I have refilled the medication(s) we provide.   If labs were collected, we will inform you of lab results once received either by echart message or telephone call.   - echart message- for normal results that have been seen by the patient already.   - telephone call: abnormal results or if patient has not viewed results in their echart.

## 2022-05-02 NOTE — Progress Notes (Signed)
Kristine [name] , 1981/05/20, 40 y.o., female MRN: [id number] Patient Care Team    Relationship Specialty Notifications Start End  Ma Hillock, DO PCP - General Family Medicine  04/28/15    Comment: Patient request female provider    Chief Complaint  Patient presents with   Gynecologic Exam     Subjective: Pt presents for an OV for pelvic exam with cervical cancer screen. Last PAP WNL. G1P1, Patient's last menstrual period was 04/11/2022. She denies complaints.       03/08/2022    9:21 AM 09/21/2021    2:04 PM 08/10/2021   10:26 AM 04/13/2021    8:43 AM 10/11/2020    3:08 PM  Depression screen PHQ 2/9  Decreased Interest 0 0 0 1 0  Down, Depressed, Hopeless 0 0 0 2 2  PHQ - 2 Score 0 0 0 3 2  Altered sleeping '1 1  3 3  '$ Tired, decreased energy '1 1  2 3  '$ Change in appetite '1 1  2 3  '$ Feeling bad or failure about yourself  0 0  2 3  Trouble concentrating 0 0  1 3  Moving slowly or fidgety/restless 0 0  0 1  Suicidal thoughts 0 0  1 1  PHQ-9 Score '3 3  14 19    '$ Allergies  Allergen Reactions   Shellfish Allergy Anaphylaxis   Social History   Social History Narrative   Ms. Correia lives with her son in [place]. 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   Past Medical History:  Diagnosis Date   Chicken pox    Frequent headaches    Hyperhidrosis    Hyperlipidemia    Hypertension    Past Surgical History:  Procedure Laterality Date   CESAREAN SECTION  2001   Family History  Problem Relation Age of Onset   Hypertension Mother    Celiac disease Son    Breast cancer Neg Hx    Allergies as of 05/02/2022       Reactions   Shellfish Allergy Anaphylaxis        Medication List        Accurate as of May 02, 2022 10:38 AM. If you have any questions, ask your nurse or doctor.          ALPRAZolam 0.25 MG tablet Commonly known as: XANAX Take 1 tablet (0.25 mg total) by mouth at bedtime as needed for anxiety.   amLODipine 10 MG  tablet Commonly known as: NORVASC Take 1 tablet (10 mg total) by mouth daily.   atorvastatin 20 MG tablet Commonly known as: LIPITOR Take 1 tablet (20 mg total) by mouth at bedtime.   PARoxetine 20 MG tablet Commonly known as: PAXIL Take 1 tablet (20 mg total) by mouth daily.   phentermine 37.5 MG tablet Commonly known as: ADIPEX-P Take 37.5 mg by mouth daily.        All past medical history, surgical history, allergies, family history, immunizations andmedications were updated in the EMR today and reviewed under the history and medication portions of their EMR.     Review of Systems  Gastrointestinal:  Negative for abdominal pain.  Genitourinary:  Negative for dysuria, frequency, hematuria and urgency.   Negative, with the exception of above mentioned in HPI   Objective:  BP 130/89   Pulse 87   Temp 98.4 F (36.9 C)   Wt 167 lb 6.4 oz (75.9 kg)   LMP 04/11/2022  SpO2 98%   BMI 30.62 kg/m  Body mass index is 30.62 kg/m. Physical Exam Vitals and nursing note reviewed.  Constitutional:      General: She is not in acute distress.    Appearance: Normal appearance. She is not ill-appearing, toxic-appearing or diaphoretic.  HENT:     Head: Normocephalic and atraumatic.  Eyes:     General: No scleral icterus. Abdominal:     General: Abdomen is flat. Bowel sounds are normal. There is no distension.     Palpations: Abdomen is soft. There is no mass.     Tenderness: There is no abdominal tenderness. There is no guarding or rebound.     Hernia: No hernia is present.  Musculoskeletal:     Right lower leg: No edema.     Left lower leg: No edema.  Skin:    General: Skin is warm and dry.     Coloration: Skin is not jaundiced or pale.     Findings: No erythema or rash.  Neurological:     Mental Status: She is alert and oriented to person, place, and time. Mental status is at baseline.     Motor: No weakness.     Gait: Gait normal.  Psychiatric:        Mood and  Affect: Mood normal.        Behavior: Behavior normal.        Thought Content: Thought content normal.        Judgment: Judgment normal.    Breasts: breasts appear normal, symmetrical, no tenderness on exam, no suspicious masses, no skin or nipple changes or axillary nodes. GYN:  External genitalia within normal limits, normal hair distribution, no lesions. Urethral meatus normal, no lesions. Vaginal mucosa pink, moist, normal rugae, no lesions. No cystocele or rectocele. cervix without lesions, no discharge. Bimanual exam revealed normal uterus.  No bladder/suprapubic fullness, masses or tenderness. No cervical motion tenderness. No adnexal fullness. Anus and perineum within normal limits, no lesions.   No results found. No results found. No results found for this or any previous visit (from the past 24 hour(s)).  Assessment/Plan: Kristine [name] is a 41 y.o. female present for OV for  Encounter for routine gynecological examination with Papanicolaou smear of cervix - Cytology - PAP( [place]) with HPV co-test completed today.  - WNL gyn exam   Reviewed expectations re: course of current medical issues. Discussed self-management of symptoms. Outlined signs and symptoms indicating need for more acute intervention. Patient verbalized understanding and all questions were answered. Patient received an After-Visit Summary.    No orders of the defined types were placed in this encounter.  Meds ordered this encounter  Medications   amLODipine (NORVASC) 10 MG tablet    Sig: Take 1 tablet (10 mg total) by mouth daily.    Dispense:  90 tablet    Refill:  0    Please add to refills   PARoxetine (PAXIL) 20 MG tablet    Sig: Take 1 tablet (20 mg total) by mouth daily.    Dispense:  90 tablet    Refill:  0    Please add to refills   Referral Orders  No referral(s) requested today     Note is dictated utilizing voice recognition software. Although note has been proof read prior  to signing, occasional typographical errors still can be missed. If any questions arise, please do not hesitate to call for verification.   electronically signed by:  Howard Pouch, DO  [facility] - [place]

## 2022-05-04 LAB — CYTOLOGY - PAP
Comment: NEGATIVE
Diagnosis: NEGATIVE
High risk HPV: NEGATIVE

## 2022-06-16 ENCOUNTER — Other Ambulatory Visit: Payer: Self-pay | Admitting: Family Medicine

## 2022-06-16 ENCOUNTER — Encounter: Payer: Self-pay | Admitting: Family Medicine

## 2022-08-15 ENCOUNTER — Encounter: Payer: Self-pay | Admitting: Family Medicine

## 2022-08-15 ENCOUNTER — Ambulatory Visit: Payer: Medicaid Other | Admitting: Family Medicine

## 2022-08-15 VITALS — BP 123/86 | HR 78 | Temp 98.6°F | Wt 172.0 lb

## 2022-08-15 DIAGNOSIS — F410 Panic disorder [episodic paroxysmal anxiety] without agoraphobia: Secondary | ICD-10-CM

## 2022-08-15 DIAGNOSIS — E559 Vitamin D deficiency, unspecified: Secondary | ICD-10-CM

## 2022-08-15 DIAGNOSIS — E785 Hyperlipidemia, unspecified: Secondary | ICD-10-CM

## 2022-08-15 DIAGNOSIS — F418 Other specified anxiety disorders: Secondary | ICD-10-CM

## 2022-08-15 DIAGNOSIS — D179 Benign lipomatous neoplasm, unspecified: Secondary | ICD-10-CM

## 2022-08-15 DIAGNOSIS — I10 Essential (primary) hypertension: Secondary | ICD-10-CM | POA: Diagnosis not present

## 2022-08-15 DIAGNOSIS — G479 Sleep disorder, unspecified: Secondary | ICD-10-CM

## 2022-08-15 LAB — CBC
HCT: 40.4 % (ref 36.0–46.0)
Hemoglobin: 13.6 g/dL (ref 12.0–15.0)
MCHC: 33.7 g/dL (ref 30.0–36.0)
MCV: 79.5 fl (ref 78.0–100.0)
Platelets: 472 10*3/uL — ABNORMAL HIGH (ref 150.0–400.0)
RBC: 5.08 Mil/uL (ref 3.87–5.11)
RDW: 13.4 % (ref 11.5–15.5)
WBC: 7.9 10*3/uL (ref 4.0–10.5)

## 2022-08-15 LAB — TSH: TSH: 1.07 u[IU]/mL (ref 0.35–5.50)

## 2022-08-15 LAB — LIPID PANEL
Cholesterol: 201 mg/dL — ABNORMAL HIGH (ref 0–200)
HDL: 55.4 mg/dL (ref >39.00)
LDL Cholesterol: 126 mg/dL — ABNORMAL HIGH (ref 0–99)
NonHDL: 145.81
Total CHOL/HDL Ratio: 4
Triglycerides: 101 mg/dL (ref 0.0–149.0)
VLDL: 20.2 mg/dL (ref 0.0–40.0)

## 2022-08-15 MED ORDER — AMLODIPINE BESYLATE 10 MG PO TABS: 10.0000 mg | ORAL_TABLET | Freq: Every day | ORAL | 0 refills | Status: DC

## 2022-08-15 MED ORDER — PAROXETINE HCL 40 MG PO TABS: 40.0000 mg | ORAL_TABLET | Freq: Every day | ORAL | 1 refills | Status: DC

## 2022-08-15 MED ORDER — ATORVASTATIN CALCIUM 20 MG PO TABS: 20.0000 mg | ORAL_TABLET | Freq: Every evening | ORAL | 3 refills | Status: DC

## 2022-08-15 NOTE — Patient Instructions (Addendum)
Return in about 24 weeks (around 01/30/2023) for Routine chronic condition follow-up.        Great to see you today.  I have refilled the medication(s) we provide.   If labs were collected, we will inform you of lab results once received either by echart message or telephone call.   - echart message- for normal results that have been seen by the patient already.   - telephone call: abnormal results or if patient has not viewed results in their echart.

## 2022-08-15 NOTE — Progress Notes (Signed)
Patient ID: Kristine [name], female  DOB: Jul 15, 1981, 42 y.o.   MRN: [id number] Patient Care Team    Relationship Specialty Notifications Start End  Ma Hillock, DO PCP - General Family Medicine  04/28/15    Comment: Patient request female provider    Chief Complaint  Patient presents with   Depression    Subjective: Kristine [name] is a 42 y.o.  Female  present for Chronic Conditions/illness Management with new acute problem.  Hypertension/overweight/HLD:  Pt reports compliance with amlodipine 10 mg.  Patient denies chest pain, shortness of breath, dizziness or lower extremity edema.  Diet: not routinely.  RF: HTN, HLD, obesity ROS: See pertinent positives and negatives per HPI.  Depression with anxiety/sleep disturbance:  She feels paxil is not working well, dose was decreased last visit.  She uses xanax as needed. She has not needed the vistaril .  xanax>. Rarely uses.  Prior note: In the past she has been prescribed buspar and paxil. Buspar was not helpful- but she did well with paxil. She had not been prescribed meds in sometime (2017).  She had been attending therapy sessions and her therapist reccommended she see a psychiatrist. The psychiatrist started her on pristiq 100, wellbutrin 150 added later, then seroquel and xanax. She reports the majority of her symptoms are related to anxiety, but she endorses depression as well. She has panic attacks at times and uses the xanax prescribed by psych at those times (rarely needs. She endorses headache and shakiness yesterday- she assumed it was from abruptly stopping pristiq.       08/15/2022    8:37 AM 03/08/2022    9:21 AM 09/21/2021    2:04 PM 08/10/2021   10:26 AM 04/13/2021    8:43 AM  Depression screen PHQ 2/9  Decreased Interest 3 0 0 0 1  Down, Depressed, Hopeless 3 0 0 0 2  PHQ - 2 Score 6 0 0 0 3  Altered sleeping '2 1 1  3  '$ Tired, decreased energy '3 1 1  2  '$ Change in appetite '1 1 1  2  '$ Feeling bad or failure  about yourself  3 0 0  2  Trouble concentrating 0 0 0  1  Moving slowly or fidgety/restless 0 0 0  0  Suicidal thoughts 0 0 0  1  PHQ-9 Score '15 3 3  14      '$ 08/15/2022    8:38 AM 03/08/2022    9:21 AM 09/21/2021    2:06 PM 04/13/2021    8:44 AM  GAD 7 : Generalized Anxiety Score  Nervous, Anxious, on Edge '3 1 1 2  '$ Control/stop worrying '3 1 1 3  '$ Worry too much - different things '3 1 1 3  '$ Trouble relaxing 2 1 0 3  Restless 0 1 0 1  Easily annoyed or irritable 2 1 0 1  Afraid - awful might happen 2 0 0 2  Total GAD 7 Score '15 6 3 15     '$ Immunization History  Administered Date(s) Administered   Influenza Inj Mdck Quad Pf 04/13/2017   Influenza,inj,Quad PF,6+ Mos 05/02/2019   Influenza-Unspecified 04/17/2015, 04/16/2016, 04/05/2017   Tdap 07/21/2013, 03/02/2021    Past Medical History:  Diagnosis Date   Chicken pox    Frequent headaches    Hyperhidrosis    Hyperlipidemia    Hypertension    Allergies  Allergen Reactions   Shellfish Allergy Anaphylaxis   Past Surgical History:  Procedure Laterality Date  CESAREAN SECTION  2001   Family History  Problem Relation Age of Onset   Hypertension Mother    Celiac disease Son    Breast cancer Neg Hx    Social History   Social History Narrative   [name] lives with her son in [place]. 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    Allergies as of 08/15/2022       Reactions   Shellfish Allergy Anaphylaxis        Medication List        Accurate as of August 15, 2022  9:15 AM. If you have any questions, ask your nurse or doctor.          STOP taking these medications    phentermine 37.5 MG tablet Commonly known as: ADIPEX-P Stopped by: Howard Pouch, DO       TAKE these medications    ALPRAZolam 0.25 MG tablet Commonly known as: XANAX Take 1 tablet (0.25 mg total) by mouth at bedtime as needed for anxiety.   amLODipine 10 MG tablet Commonly known as: NORVASC Take 1 tablet (10 mg total)  by mouth daily.   atorvastatin 20 MG tablet Commonly known as: LIPITOR Take 1 tablet (20 mg total) by mouth at bedtime.   PARoxetine 40 MG tablet Commonly known as: PAXIL Take 1 tablet (40 mg total) by mouth daily. What changed:  medication strength how much to take Changed by: Howard Pouch, DO        All past medical history, surgical history, allergies, family history, immunizations andmedications were updated in the EMR today and reviewed under the history and medication portions of their EMR.     No results found for this or any previous visit (from the past 2160 hour(s)).  No results found.   ROS 14 pt review of systems performed and negative (unless mentioned in an HPI)  Objective: BP 123/86   Pulse 78   Temp 98.6 F (37 C)   Wt 172 lb (78 kg)   SpO2 96%   BMI 31.46 kg/m  Physical Exam Vitals and nursing note reviewed.  Constitutional:      General: She is not in acute distress.    Appearance: Normal appearance. She is not ill-appearing, toxic-appearing or diaphoretic.  HENT:     Head: Normocephalic and atraumatic.  Eyes:     General: No scleral icterus.       Right eye: No discharge.        Left eye: No discharge.     Extraocular Movements: Extraocular movements intact.     Conjunctiva/sclera: Conjunctivae normal.     Pupils: Pupils are equal, round, and reactive to light.  Cardiovascular:     Rate and Rhythm: Normal rate and regular rhythm.  Pulmonary:     Effort: Pulmonary effort is normal. No respiratory distress.     Breath sounds: Normal breath sounds. No wheezing, rhonchi or rales.  Musculoskeletal:     Right lower leg: No edema.     Left lower leg: No edema.     Comments: large lipoma above right clavicle (~size of an orange)  Skin:    General: Skin is warm.     Findings: No rash.  Neurological:     Mental Status: She is alert and oriented to person, place, and time. Mental status is at baseline.     Motor: No weakness.     Gait: Gait  normal.  Psychiatric:        Mood and  Affect: Mood normal.        Behavior: Behavior normal.        Thought Content: Thought content normal.        Judgment: Judgment normal.      No results found.  Assessment/plan: Kristine [name] is a 42 y.o. female present for routine chronic condition management Essential hypertension/Hyperlipidemia LDL goal <130/on statin/obesity Stable Continue amlodipine 10 mg QD.  Taking Lipitor Cbc, tsh and lipids collected today - continue exercise and low salt diet.  - F/U 5.5 mos.  Anxiety with depression/Panic attacks/sleep disturbance Stable increase Paxil back to 40 mg daily  Continue xanax prn- rarely needs. Refills provided today. [place] reviewed.  Tried meds: BuSpar- did not work well.  Paxil-worked well.  Pristiq-did not seem to work well since needing to continue to increase dose and add other meds.  Wellbutrin-may have worked okay but ran out.  Xanax-rarely needs.  Seroquel-madetoo sleepy even on 12.5 mg F/u 5.5 mos- sooner if needed.   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 Becoming bothersome.  - referred to gen surg to discuss options   Return in about 24 weeks (around 01/30/2023) for Routine chronic condition follow-up.    Orders Placed This Encounter  Procedures   Lipid panel   TSH   CBC   Ambulatory referral to General Surgery   Meds ordered this encounter  Medications   amLODipine (NORVASC) 10 MG tablet    Sig: Take 1 tablet (10 mg total) by mouth daily.    Dispense:  90 tablet    Refill:  0    Please add to refills   atorvastatin (LIPITOR) 20 MG tablet    Sig: Take 1 tablet (20 mg total) by mouth at bedtime.    Dispense:  90 tablet    Refill:  3   PARoxetine (PAXIL) 40 MG tablet    Sig: Take 1 tablet (40 mg total) by mouth daily.    Dispense:  90 tablet    Refill:  1   Referral Orders         Ambulatory referral to General Surgery      Electronically signed by: Howard Pouch, DO [place]

## 2022-08-17 DIAGNOSIS — B9689 Other specified bacterial agents as the cause of diseases classified elsewhere: Secondary | ICD-10-CM | POA: Diagnosis not present

## 2022-08-17 DIAGNOSIS — J019 Acute sinusitis, unspecified: Secondary | ICD-10-CM | POA: Diagnosis not present

## 2022-08-22 ENCOUNTER — Other Ambulatory Visit: Payer: Self-pay | Admitting: General Surgery

## 2022-08-22 DIAGNOSIS — R2231 Localized swelling, mass and lump, right upper limb: Secondary | ICD-10-CM | POA: Diagnosis not present

## 2022-09-12 ENCOUNTER — Other Ambulatory Visit: Payer: Self-pay

## 2022-09-12 ENCOUNTER — Encounter (HOSPITAL_BASED_OUTPATIENT_CLINIC_OR_DEPARTMENT_OTHER): Payer: Self-pay | Admitting: General Surgery

## 2022-09-14 ENCOUNTER — Encounter (HOSPITAL_BASED_OUTPATIENT_CLINIC_OR_DEPARTMENT_OTHER)
Admission: RE | Admit: 2022-09-14 | Discharge: 2022-09-14 | Disposition: A | Discharge status: Home / Self Care | Payer: Medicaid Other | Source: Ambulatory Visit | Attending: General Surgery | Admitting: General Surgery

## 2022-09-14 DIAGNOSIS — Z01810 Encounter for preprocedural cardiovascular examination: Secondary | ICD-10-CM | POA: Insufficient documentation

## 2022-09-14 DIAGNOSIS — I10 Essential (primary) hypertension: Secondary | ICD-10-CM | POA: Diagnosis not present

## 2022-09-14 MED ORDER — ENSURE PRE-SURGERY PO LIQD: 296.0000 mL | Freq: Once | ORAL | Status: DC

## 2022-09-14 NOTE — Progress Notes (Signed)

## 2022-09-18 NOTE — Anesthesia Preprocedure Evaluation (Signed)
Anesthesia Evaluation  Patient identified by MRN, date of birth, ID band Patient awake    Reviewed: Allergy & Precautions, NPO status , Patient's Chart, lab work & pertinent test results  Airway Mallampati: III  TM Distance: >3 FB Neck ROM: Full    Dental  (+) Teeth Intact, Dental Advisory Given   Pulmonary neg pulmonary ROS Snores at night, has never been tested for sleep apnea   Pulmonary exam normal breath sounds clear to auscultation       Cardiovascular hypertension (119/90 preop), Pt. on medications Normal cardiovascular exam Rhythm:Regular Rate:Normal     Neuro/Psych  Headaches PSYCHIATRIC DISORDERS Anxiety Depression       GI/Hepatic negative GI ROS, Neg liver ROS,,,  Endo/Other  negative endocrine ROS    Renal/GU negative Renal ROS  negative genitourinary   Musculoskeletal negative musculoskeletal ROS (+)    Abdominal   Peds  Hematology negative hematology ROS (+)   Anesthesia Other Findings   Reproductive/Obstetrics negative OB ROS                              Anesthesia Physical Anesthesia Plan  ASA: 2  Anesthesia Plan: General   Post-op Pain Management: Tylenol PO (pre-op)* and Toradol IV (intra-op)*   Induction: Intravenous  PONV Risk Score and Plan: 3 and Ondansetron, Dexamethasone, Midazolam and Treatment may vary due to age or medical condition  Airway Management Planned: LMA  Additional Equipment: None  Intra-op Plan:   Post-operative Plan: Extubation in OR  Informed Consent: I have reviewed the patients History and Physical, chart, labs and discussed the procedure including the risks, benefits and alternatives for the proposed anesthesia with the patient or authorized representative who has indicated his/her understanding and acceptance.     Dental advisory given  Plan Discussed with: CRNA  Anesthesia Plan Comments:         Anesthesia Quick  Evaluation

## 2022-09-19 ENCOUNTER — Other Ambulatory Visit: Payer: Self-pay

## 2022-09-19 ENCOUNTER — Ambulatory Visit (HOSPITAL_BASED_OUTPATIENT_CLINIC_OR_DEPARTMENT_OTHER)
Admission: RE | Admit: 2022-09-19 | Discharge: 2022-09-19 | Disposition: A | Discharge status: Home / Self Care | Payer: Medicaid Other | Attending: General Surgery | Admitting: General Surgery

## 2022-09-19 ENCOUNTER — Ambulatory Visit (HOSPITAL_BASED_OUTPATIENT_CLINIC_OR_DEPARTMENT_OTHER): Payer: Medicaid Other | Admitting: Anesthesiology

## 2022-09-19 ENCOUNTER — Encounter (HOSPITAL_BASED_OUTPATIENT_CLINIC_OR_DEPARTMENT_OTHER): Payer: Self-pay | Admitting: General Surgery

## 2022-09-19 ENCOUNTER — Encounter (HOSPITAL_BASED_OUTPATIENT_CLINIC_OR_DEPARTMENT_OTHER)
Admission: RE | Disposition: A | Discharge status: Home / Self Care | Payer: Self-pay | Source: Home / Self Care | Attending: General Surgery

## 2022-09-19 DIAGNOSIS — I10 Essential (primary) hypertension: Secondary | ICD-10-CM | POA: Diagnosis not present

## 2022-09-19 DIAGNOSIS — D1739 Benign lipomatous neoplasm of skin and subcutaneous tissue of other sites: Secondary | ICD-10-CM | POA: Diagnosis not present

## 2022-09-19 DIAGNOSIS — F419 Anxiety disorder, unspecified: Secondary | ICD-10-CM | POA: Insufficient documentation

## 2022-09-19 DIAGNOSIS — D1721 Benign lipomatous neoplasm of skin and subcutaneous tissue of right arm: Secondary | ICD-10-CM

## 2022-09-19 DIAGNOSIS — D171 Benign lipomatous neoplasm of skin and subcutaneous tissue of trunk: Secondary | ICD-10-CM | POA: Diagnosis present

## 2022-09-19 DIAGNOSIS — R519 Headache, unspecified: Secondary | ICD-10-CM | POA: Insufficient documentation

## 2022-09-19 DIAGNOSIS — Z01818 Encounter for other preprocedural examination: Secondary | ICD-10-CM

## 2022-09-19 DIAGNOSIS — E785 Hyperlipidemia, unspecified: Secondary | ICD-10-CM | POA: Diagnosis not present

## 2022-09-19 HISTORY — DX: Anxiety disorder, unspecified: F41.9

## 2022-09-19 HISTORY — PX: MASS EXCISION: SHX2000

## 2022-09-19 HISTORY — DX: Depression, unspecified: F32.A

## 2022-09-19 LAB — POCT PREGNANCY, URINE: Preg Test, Ur: NEGATIVE

## 2022-09-19 SURGERY — EXCISION MASS
Anesthesia: General | Site: Shoulder | Laterality: Right

## 2022-09-19 MED ORDER — CEFAZOLIN SODIUM-DEXTROSE 2-4 GM/100ML-% IV SOLN
INTRAVENOUS | Status: AC
  Filled 2022-09-19: qty 100

## 2022-09-19 MED ORDER — LIDOCAINE HCL (PF) 1 % IJ SOLN
INTRAMUSCULAR | Status: AC
  Filled 2022-09-19: qty 30

## 2022-09-19 MED ORDER — LIDOCAINE HCL (CARDIAC) PF 100 MG/5ML IV SOSY
PREFILLED_SYRINGE | INTRAVENOUS | Status: DC | PRN
  Administered 2022-09-19: 60 mg via INTRAVENOUS

## 2022-09-19 MED ORDER — FENTANYL CITRATE (PF) 100 MCG/2ML IJ SOLN
INTRAMUSCULAR | Status: AC
  Filled 2022-09-19: qty 2

## 2022-09-19 MED ORDER — CELECOXIB 200 MG PO CAPS
ORAL_CAPSULE | ORAL | Status: AC
  Filled 2022-09-19: qty 2

## 2022-09-19 MED ORDER — FENTANYL CITRATE (PF) 100 MCG/2ML IJ SOLN
INTRAMUSCULAR | Status: DC | PRN
  Administered 2022-09-19 (×2): 50 ug via INTRAVENOUS

## 2022-09-19 MED ORDER — ACETAMINOPHEN 500 MG PO TABS
ORAL_TABLET | ORAL | Status: AC
  Filled 2022-09-19: qty 2

## 2022-09-19 MED ORDER — LIDOCAINE-EPINEPHRINE (PF) 1 %-1:200000 IJ SOLN
INTRAMUSCULAR | Status: AC
  Filled 2022-09-19: qty 30

## 2022-09-19 MED ORDER — MEPERIDINE HCL 25 MG/ML IJ SOLN: 6.2500 mg | INTRAMUSCULAR | Status: DC | PRN

## 2022-09-19 MED ORDER — LIDOCAINE 2% (20 MG/ML) 5 ML SYRINGE
INTRAMUSCULAR | Status: AC
  Filled 2022-09-19: qty 5

## 2022-09-19 MED ORDER — DEXAMETHASONE SODIUM PHOSPHATE 4 MG/ML IJ SOLN
INTRAMUSCULAR | Status: DC | PRN
  Administered 2022-09-19: 10 mg via INTRAVENOUS

## 2022-09-19 MED ORDER — ONDANSETRON HCL 4 MG/2ML IJ SOLN
INTRAMUSCULAR | Status: AC
  Filled 2022-09-19: qty 2

## 2022-09-19 MED ORDER — MIDAZOLAM HCL 2 MG/2ML IJ SOLN
INTRAMUSCULAR | Status: AC
  Filled 2022-09-19: qty 2

## 2022-09-19 MED ORDER — CELECOXIB 200 MG PO CAPS
400.0000 mg | ORAL_CAPSULE | ORAL | Status: AC
  Administered 2022-09-19: 400 mg via ORAL

## 2022-09-19 MED ORDER — BUPIVACAINE HCL (PF) 0.25 % IJ SOLN
INTRAMUSCULAR | Status: AC
  Filled 2022-09-19: qty 30

## 2022-09-19 MED ORDER — KETOROLAC TROMETHAMINE 30 MG/ML IJ SOLN: 30.0000 mg | Freq: Once | INTRAMUSCULAR | Status: DC | PRN

## 2022-09-19 MED ORDER — CHLORHEXIDINE GLUCONATE CLOTH 2 % EX PADS: 6.0000 | MEDICATED_PAD | Freq: Once | CUTANEOUS | Status: DC

## 2022-09-19 MED ORDER — OXYCODONE HCL 5 MG PO TABS: 5.0000 mg | ORAL_TABLET | Freq: Once | ORAL | Status: DC | PRN

## 2022-09-19 MED ORDER — BUPIVACAINE HCL (PF) 0.25 % IJ SOLN
INTRAMUSCULAR | Status: DC | PRN
  Administered 2022-09-19: 5 mL

## 2022-09-19 MED ORDER — DEXAMETHASONE SODIUM PHOSPHATE 10 MG/ML IJ SOLN
INTRAMUSCULAR | Status: AC
  Filled 2022-09-19: qty 1

## 2022-09-19 MED ORDER — PROPOFOL 500 MG/50ML IV EMUL
INTRAVENOUS | Status: AC
  Filled 2022-09-19: qty 50

## 2022-09-19 MED ORDER — LACTATED RINGERS IV SOLN: INTRAVENOUS | Status: DC

## 2022-09-19 MED ORDER — CEFAZOLIN SODIUM-DEXTROSE 2-4 GM/100ML-% IV SOLN
2.0000 g | INTRAVENOUS | Status: AC
  Administered 2022-09-19: 2 g via INTRAVENOUS

## 2022-09-19 MED ORDER — MIDAZOLAM HCL 5 MG/5ML IJ SOLN
INTRAMUSCULAR | Status: DC | PRN
  Administered 2022-09-19: 2 mg via INTRAVENOUS

## 2022-09-19 MED ORDER — OXYCODONE HCL 5 MG/5ML PO SOLN: 5.0000 mg | Freq: Once | ORAL | Status: DC | PRN

## 2022-09-19 MED ORDER — HYDROMORPHONE HCL 1 MG/ML IJ SOLN: 0.2500 mg | INTRAMUSCULAR | Status: DC | PRN

## 2022-09-19 MED ORDER — ACETAMINOPHEN 500 MG PO TABS
1000.0000 mg | ORAL_TABLET | ORAL | Status: AC
  Administered 2022-09-19: 1000 mg via ORAL

## 2022-09-19 MED ORDER — ONDANSETRON HCL 4 MG/2ML IJ SOLN
INTRAMUSCULAR | Status: DC | PRN
  Administered 2022-09-19: 4 mg via INTRAVENOUS

## 2022-09-19 MED ORDER — ONDANSETRON HCL 4 MG/2ML IJ SOLN: 4.0000 mg | Freq: Once | INTRAMUSCULAR | Status: DC | PRN

## 2022-09-19 MED ORDER — PROPOFOL 10 MG/ML IV BOLUS
INTRAVENOUS | Status: DC | PRN
  Administered 2022-09-19: 200 mg via INTRAVENOUS

## 2022-09-19 MED ORDER — AMISULPRIDE (ANTIEMETIC) 5 MG/2ML IV SOLN: 10.0000 mg | Freq: Once | INTRAVENOUS | Status: DC | PRN

## 2022-09-19 SURGICAL SUPPLY — 46 items
ADH SKN CLS APL DERMABOND .7 (GAUZE/BANDAGES/DRESSINGS) ×1
APL PRP STRL LF DISP 70% ISPRP (MISCELLANEOUS) ×1
BLADE CLIPPER SURG (BLADE) IMPLANT
BLADE SURG 15 STRL LF DISP TIS (BLADE) ×1 IMPLANT
BLADE SURG 15 STRL SS (BLADE) ×1
CANISTER SUCT 1200ML W/VALVE (MISCELLANEOUS) IMPLANT
CHLORAPREP W/TINT 26 (MISCELLANEOUS) ×1 IMPLANT
CLSR STERI-STRIP ANTIMIC 1/2X4 (GAUZE/BANDAGES/DRESSINGS) ×1 IMPLANT
COVER BACK TABLE 60X90IN (DRAPES) ×1 IMPLANT
COVER MAYO STAND STRL (DRAPES) ×1 IMPLANT
DERMABOND ADVANCED .7 DNX12 (GAUZE/BANDAGES/DRESSINGS) ×1 IMPLANT
DRAPE LAPAROTOMY 100X72 PEDS (DRAPES) ×1 IMPLANT
DRAPE UTILITY XL STRL (DRAPES) ×1 IMPLANT
DRSG TEGADERM 4X4.75 (GAUZE/BANDAGES/DRESSINGS) IMPLANT
ELECT COATED BLADE 2.86 ST (ELECTRODE) IMPLANT
ELECT REM PT RETURN 9FT ADLT (ELECTROSURGICAL) ×1
ELECTRODE REM PT RTRN 9FT ADLT (ELECTROSURGICAL) ×1 IMPLANT
GAUZE PACKING IODOFORM 1/4X15 (PACKING) IMPLANT
GAUZE SPONGE 4X4 12PLY STRL LF (GAUZE/BANDAGES/DRESSINGS) IMPLANT
GLOVE BIO SURGEON STRL SZ7 (GLOVE) ×1 IMPLANT
GLOVE BIOGEL PI IND STRL 7.5 (GLOVE) ×1 IMPLANT
GOWN STRL REUS W/ TWL LRG LVL3 (GOWN DISPOSABLE) ×3 IMPLANT
GOWN STRL REUS W/TWL LRG LVL3 (GOWN DISPOSABLE) ×3
KIT MARKER MARGIN INK (KITS) IMPLANT
NDL HYPO 25X1 1.5 SAFETY (NEEDLE) ×1 IMPLANT
NEEDLE HYPO 25X1 1.5 SAFETY (NEEDLE) ×1 IMPLANT
NS IRRIG 1000ML POUR BTL (IV SOLUTION) IMPLANT
PACK BASIN DAY SURGERY FS (CUSTOM PROCEDURE TRAY) ×1 IMPLANT
PENCIL SMOKE EVACUATOR (MISCELLANEOUS) ×1 IMPLANT
SLEEVE SCD COMPRESS KNEE MED (STOCKING) ×1 IMPLANT
SPIKE FLUID TRANSFER (MISCELLANEOUS) IMPLANT
SPONGE T-LAP 4X18 ~~LOC~~+RFID (SPONGE) ×1 IMPLANT
SUT ETHILON 2 0 FS 18 (SUTURE) IMPLANT
SUT MNCRL AB 4-0 PS2 18 (SUTURE) ×1 IMPLANT
SUT SILK 2 0 SH (SUTURE) IMPLANT
SUT VIC AB 2-0 SH 27 (SUTURE)
SUT VIC AB 2-0 SH 27XBRD (SUTURE) IMPLANT
SUT VICRYL 3-0 CR8 SH (SUTURE) IMPLANT
SUT VICRYL 4-0 PS2 18IN ABS (SUTURE) IMPLANT
SWAB COLLECTION DEVICE MRSA (MISCELLANEOUS) IMPLANT
SWAB CULTURE ESWAB REG 1ML (MISCELLANEOUS) IMPLANT
SYR CONTROL 10ML LL (SYRINGE) ×1 IMPLANT
TOWEL GREEN STERILE FF (TOWEL DISPOSABLE) ×1 IMPLANT
TUBE CONNECTING 20X1/4 (TUBING) IMPLANT
UNDERPAD 30X36 HEAVY ABSORB (UNDERPADS AND DIAPERS) IMPLANT
YANKAUER SUCT BULB TIP NO VENT (SUCTIONS) IMPLANT

## 2022-09-19 NOTE — H&P (Signed)
  42 year old female who presents today with a recovering bout of sinusitis for which she is on prednisone and amoxicillin. She has had a right shoulder mass really above her right clavicle since 2020. This is caused her some discomfort recently. This has gotten significantly bigger over that time. She is here today with her mom to discuss her options. This does not have a history of infection.  Review of Systems: A complete review of systems was obtained from the patient. I have reviewed this information and discussed as appropriate with the patient. See HPI as well for other ROS.  Review of Systems HENT: Positive for congestion. Psychiatric/Behavioral: Positive for depression. The patient is nervous/anxious. All other systems reviewed and are negative.   Medical History: Past Medical History: Diagnosis Date Anxiety Arthritis Hyperlipidemia Hypertension  There is no problem list on file for this patient.  Past Surgical History: Procedure Laterality Date CYST REMOVAL SURGERY 1990 CESAREAN SECTION 2003   Allergies Allergen Reactions Shellfish Derived Anaphylaxis  Current Outpatient Medications on File Prior to Visit Medication Sig Dispense Refill ALPRAZolam (XANAX) 0.25 MG tablet Take by mouth amLODIPine (NORVASC) 10 MG tablet Take by mouth amoxicillin-clavulanate (AUGMENTIN) 875-125 mg tablet Take by mouth atorvastatin (LIPITOR) 20 MG tablet Take by mouth PARoxetine (PAXIL) 40 MG tablet Take by mouth predniSONE (DELTASONE) 20 MG tablet Take 3 tablets once daily x 2 days then take 2 tablets once daily x 3 days then take 1 tablet once daily x 2 days. Take with food.  No current facility-administered medications on file prior to visit.  History reviewed. No pertinent family history.  Social History  Tobacco Use Smoking Status Never Smokeless Tobacco Never Marital status: Single Tobacco Use Smoking status: Never Smokeless tobacco: Never Substance and Sexual  Activity Alcohol use: Yes Drug use: Never  Objective:  Vitals: 08/22/22 1534 BP: 122/88 Pulse: 91 Temp: 36.4 C (97.6 F) SpO2: 98% Weight: 78.9 kg (174 lb) Height: 160 cm ('5\' 3"'$ ) PainSc: 0-No pain PainLoc: Shoulder  Body mass index is 30.82 kg/m.  Physical Exam Vitals reviewed. Constitutional: Appearance: Normal appearance. Chest: Comments: At least 8 x 8 cm subcutaneous soft tissue mass that is mobile and nontender consistent with a lipoma Neurological: Mental Status: She is alert.   Assessment and Plan:  Diagnoses and all orders for this visit:  Shoulder mass  This area is gotten considerably bigger since 2020. We discussed excision. Or observation. I think it should be excised given its rapid growth. We discussed excision at the surgery center as well as the risks associated with this.

## 2022-09-19 NOTE — Op Note (Signed)
Preoperative diagnosis: Supraclavicular lipoma Postoperative diagnosis: Same as above Procedure: Excision of supraclavicular/right shoulder lipoma 12x5 cm, subcutaneous Surgeon: Dr. Serita Grammes Anesthesia: General Complications: None Drains: None Estimated blood loss: Minimal Specimens: Clinically lipoma to pathology Sponge count was correct completion Disposition recovery stable condition  Indication: This 42 year old female who has had a right shoulder supraclavicular mass since about 2020.  This is gotten significantly bigger and causing some discomfort.  We discussed excisional biopsy.  Procedure: After informed consent was obtained she was taken to the operating room.  She was given antibiotics.  SCDs were placed.  She was placed under general anesthesia without complication.  She was prepped and draped in the standard sterile surgical fashion.  Surgical timeout was then performed.  I filtrated Marcaine throughout the area.  I then made a small incision overlying the mass.  I used cautery to enter to where the capsule was.  I then removed the lipoma in its entirety.  Hemostasis was observed.  I then closed this with 3-0 Vicryl.  Skin was closed with 4 Monocryl and glue.  She tolerated this well and was transferred recovery stable.

## 2022-09-19 NOTE — Discharge Instructions (Addendum)
[facility] Office Phone Number [phone]  POST OP INSTRUCTIONS Take 400 mg of ibuprofen every 8 hours or 650 mg tylenol every 6 hours for next 72 hours then as needed. Use ice several times daily also.   Take your usually prescribed medications unless otherwise directed You should eat light the first 24 hours after surgery, such as soup, crackers, pudding, etc.  Resume your normal diet the day after surgery. Most patients will experience some swelling and bruising  Ice packs will help. Swelling and bruising can take several days to resolve.  It is common to experience some constipation if taking pain medication after surgery.  Increasing fluid intake and taking a stool softener will usually help or prevent this problem from occurring.  A mild laxative (Milk of Magnesia or Miralax) should be taken according to package directions if there are no bowel movements after 48 hours. I used skin glue on the incision, you may shower in 24 hours.  The glue will flake off over the next 2-3 weeks.  Any sutures or staples will be removed at the office during your follow-up visit. ACTIVITIES:  You may resume regular daily activities (gradually increasing) beginning the next day.  Wearing a good support bra or sports bra minimizes pain and swelling.  You may have sexual intercourse when it is comfortable. You may drive when you no longer are taking prescription pain medication, you can comfortably wear a seatbelt, and you can safely maneuver your car and apply brakes. RETURN TO WORK:  ______________________________________________________________________________________ Dennis Bast should see your doctor in the office for a follow-up appointment approximately two weeks after your surgery.  Your doctor's nurse will typically make your follow-up appointment when she calls you with your pathology report.  Expect your pathology report 3-4 business days after your surgery.  You may call to check if you do not  hear from [place] after three days. OTHER INSTRUCTIONS: _______________________________________________________________________________________________ _____________________________________________________________________________________________________________________________________ _____________________________________________________________________________________________________________________________________ _____________________________________________________________________________________________________________________________________  WHEN TO CALL DR WAKEFIELD: Fever over 101.0 Nausea and/or vomiting. Extreme swelling or bruising. Continued bleeding from incision. Increased pain, redness, or drainage from the incision.  The clinic staff is available to answer your questions during regular business hours.  Please don't hesitate to call and ask to speak to one of the nurses for clinical concerns.  If you have a medical emergency, go to the nearest emergency room or call 911.  A surgeon from [facility] is always on call at the hospital.  For further questions, please visit [email or web address]   Post Anesthesia Home Care Instructions  Activity: Get plenty of rest for the remainder of the day. A responsible individual must stay with you for 24 hours following the procedure.  For the next 24 hours, DO NOT: -Drive a car -Paediatric nurse -Drink alcoholic beverages -Take any medication unless instructed by your physician -Make any legal decisions or sign important papers.  Meals: Start with liquid foods such as gelatin or soup. Progress to regular foods as tolerated. Avoid greasy, spicy, heavy foods. If nausea and/or vomiting occur, drink only clear liquids until the nausea and/or vomiting subsides. Call your physician if vomiting continues.  Special Instructions/Symptoms: Your throat may feel dry or sore from the anesthesia or the breathing tube placed in  your throat during surgery. If this causes discomfort, gargle with warm salt water. The discomfort should disappear within 24 hours.  If you had a scopolamine patch placed behind your ear for the management of post- operative nausea and/or vomiting:  1. The  medication in the patch is effective for 72 hours, after which it should be removed.  Wrap patch in a tissue and discard in the trash. Wash hands thoroughly with soap and water. 2. You may remove the patch earlier than 72 hours if you experience unpleasant side effects which may include dry mouth, dizziness or visual disturbances. 3. Avoid touching the patch. Wash your hands with soap and water after contact with the patch.     May have Tylenol or Ibuprofen today after 2:53 PM.

## 2022-09-19 NOTE — Anesthesia Procedure Notes (Signed)
Procedure Name: LMA Insertion Date/Time: 09/19/2022 10:09 AM  Performed by: Maryella Shivers, CRNAPre-anesthesia Checklist: Patient identified, Emergency Drugs available, Suction available and Patient being monitored Patient Re-evaluated:Patient Re-evaluated prior to induction Oxygen Delivery Method: Circle system utilized Preoxygenation: Pre-oxygenation with 100% oxygen Induction Type: IV induction Ventilation: Mask ventilation without difficulty LMA: LMA inserted LMA Size: 4.0 Number of attempts: 1 Airway Equipment and Method: Bite block Placement Confirmation: positive ETCO2 Tube secured with: Tape Dental Injury: Teeth and Oropharynx as per pre-operative assessment

## 2022-09-19 NOTE — Transfer of Care (Signed)
Immediate Anesthesia Transfer of Care Note  Patient: Kristine [name]  Procedure(s) Performed: EXCISION OF RIGHT SHOULDER MASS (Right: Shoulder)  Patient Location: PACU  Anesthesia Type:General  Level of Consciousness: sedated  Airway & Oxygen Therapy: Patient Spontanous Breathing and Patient connected to face mask oxygen  Post-op Assessment: Report given to RN and Post -op Vital signs reviewed and stable  Post vital signs: Reviewed and stable  Last Vitals:  Vitals Value Taken Time  BP 102/69 09/19/22 1036  Temp 36.4 C 09/19/22 1036  Pulse 77 09/19/22 1037  Resp 18 09/19/22 1037  SpO2 96 % 09/19/22 1037  Vitals shown include unvalidated device data.  Last Pain:  Vitals:   09/19/22 0849  TempSrc: Oral  PainSc: 0-No pain      Patients Stated Pain Goal: 5 ([id number])  Complications: No notable events documented.

## 2022-09-19 NOTE — Anesthesia Postprocedure Evaluation (Signed)
Anesthesia Post Note  Patient: Kristine [name]  Procedure(s) Performed: EXCISION OF RIGHT SHOULDER MASS (Right: Shoulder)     Patient location during evaluation: PACU Anesthesia Type: General Level of consciousness: awake and alert, oriented and patient cooperative Pain management: pain level controlled Vital Signs Assessment: post-procedure vital signs reviewed and stable Respiratory status: spontaneous breathing, nonlabored ventilation and respiratory function stable Cardiovascular status: blood pressure returned to baseline and stable Postop Assessment: no apparent nausea or vomiting Anesthetic complications: no   No notable events documented.  Last Vitals:  Vitals:   09/19/22 1106 09/19/22 1126  BP: 124/88 139/89  Pulse:  75  Resp:  20  Temp:  (!) 36.3 C  SpO2: 95% 94%    Last Pain:  Vitals:   09/19/22 1126  TempSrc: Temporal  PainSc: 0-No pain                 Pervis Hocking

## 2022-09-19 NOTE — Interval H&P Note (Signed)
History and Physical Interval Note:  09/19/2022 9:55 AM  Kristine [name]  has presented today for surgery, with the diagnosis of RIGHT SHOULDER MASS.  The various methods of treatment have been discussed with the patient and family. After consideration of risks, benefits and other options for treatment, the patient has consented to  Procedure(s) with comments: EXCISION OF RIGHT SHOULDER MASS (Right) - LMA as a surgical intervention.  The patient's history has been reviewed, patient examined, no change in status, stable for surgery.  I have reviewed the patient's chart and labs.  Questions were answered to the patient's satisfaction.     Rolm Bookbinder

## 2022-09-20 ENCOUNTER — Encounter (HOSPITAL_BASED_OUTPATIENT_CLINIC_OR_DEPARTMENT_OTHER): Payer: Self-pay | Admitting: General Surgery

## 2022-09-20 LAB — SURGICAL PATHOLOGY

## 2022-09-20 NOTE — Progress Notes (Signed)
Left message stating courtesy call and if any questions or concerns please call the doctors office.  

## 2022-10-17 ENCOUNTER — Encounter: Payer: Self-pay | Admitting: Family Medicine

## 2022-10-17 ENCOUNTER — Ambulatory Visit: Payer: Medicaid Other | Admitting: Family Medicine

## 2022-10-17 VITALS — BP 121/82 | HR 83 | Temp 98.4°F | Wt 180.6 lb

## 2022-10-17 DIAGNOSIS — I10 Essential (primary) hypertension: Secondary | ICD-10-CM | POA: Diagnosis not present

## 2022-10-17 DIAGNOSIS — F418 Other specified anxiety disorders: Secondary | ICD-10-CM

## 2022-10-17 DIAGNOSIS — F410 Panic disorder [episodic paroxysmal anxiety] without agoraphobia: Secondary | ICD-10-CM | POA: Diagnosis not present

## 2022-10-17 DIAGNOSIS — G479 Sleep disorder, unspecified: Secondary | ICD-10-CM | POA: Diagnosis not present

## 2022-10-17 DIAGNOSIS — E785 Hyperlipidemia, unspecified: Secondary | ICD-10-CM | POA: Diagnosis not present

## 2022-10-17 MED ORDER — ALPRAZOLAM 0.25 MG PO TABS: 0.2500 mg | ORAL_TABLET | Freq: Every evening | ORAL | 2 refills | Status: DC | PRN

## 2022-10-17 MED ORDER — AMLODIPINE BESYLATE 10 MG PO TABS: 10.0000 mg | ORAL_TABLET | Freq: Every day | ORAL | 1 refills | Status: DC

## 2022-10-17 MED ORDER — BUPROPION HCL ER (XL) 150 MG PO TB24: 150.0000 mg | ORAL_TABLET | Freq: Every day | ORAL | 1 refills | Status: DC

## 2022-10-17 NOTE — Progress Notes (Signed)
Patient ID: Kristine [name], female  DOB: 06-Jun-1981, 42 y.o.   MRN: [id number] Patient Care Team    Relationship Specialty Notifications Start End  Ma Hillock, DO PCP - General Family Medicine  04/28/15    Comment: Patient request female provider    Chief Complaint  Patient presents with   Hypertension    [place];    Subjective: Kristine [name] is a 42 y.o.  Female  present for Chronic Conditions/illness Management with new acute problem.  Hypertension/overweight/HLD:  Pt reports compliance with amlodipine 10 mg.  Patient denies chest pain, shortness of breath, dizziness or lower extremity edema.    Diet: not routinely.  RF: HTN, HLD, obesity ROS: See pertinent positives and negatives per HPI.  Depression with anxiety/sleep disturbance:  Still having increased stress.  She feels paxil is not working well, dose was decreased last visit.  She uses xanax as needed. She has not needed the vistaril .  xanax>. Rarely uses.  Prior note: In the past she has been prescribed buspar and paxil. Buspar was not helpful- but she did well with paxil. She had not been prescribed meds in sometime (2017).  She had been attending therapy sessions and her therapist reccommended she see a psychiatrist. The psychiatrist started her on pristiq 100, wellbutrin 150 added later, then seroquel and xanax. She reports the majority of her symptoms are related to anxiety, but she endorses depression as well. She has panic attacks at times and uses the xanax prescribed by psych at those times (rarely needs. She endorses headache and shakiness yesterday- she assumed it was from abruptly stopping pristiq.       10/17/2022    8:56 AM 08/15/2022    8:37 AM 03/08/2022    9:21 AM 09/21/2021    2:04 PM 08/10/2021   10:26 AM  Depression screen PHQ 2/9  Decreased Interest 3 3 0 0 0  Down, Depressed, Hopeless 3 3 0 0 0  PHQ - 2 Score 6 6 0 0 0  Altered sleeping 1 2 1 1    Tired, decreased energy 2 3 1 1    Change in  appetite 2 1 1 1    Feeling bad or failure about yourself  2 3 0 0   Trouble concentrating 0 0 0 0   Moving slowly or fidgety/restless 0 0 0 0   Suicidal thoughts 1 0 0 0   PHQ-9 Score 14 15 3 3        10/17/2022    8:58 AM 08/15/2022    8:38 AM 03/08/2022    9:21 AM 09/21/2021    2:06 PM  GAD 7 : Generalized Anxiety Score  Nervous, Anxious, on Edge 3 3 1 1   Control/stop worrying 2 3 1 1   Worry too much - different things 2 3 1 1   Trouble relaxing 0 2 1 0  Restless 0 0 1 0  Easily annoyed or irritable 2 2 1  0  Afraid - awful might happen 2 2 0 0  Total GAD 7 Score 11 15 6 3      Immunization History  Administered Date(s) Administered   Influenza Inj Mdck Quad Pf 04/13/2017   Influenza,inj,Quad PF,6+ Mos 05/02/2019   Influenza-Unspecified 04/17/2015, 04/16/2016, 04/05/2017   Tdap 07/21/2013, 03/02/2021    Past Medical History:  Diagnosis Date   Anxiety    Chicken pox    Depression    Frequent headaches    Hyperhidrosis    Hyperlipidemia    Hypertension  Allergies  Allergen Reactions   Shellfish Allergy Anaphylaxis   Past Surgical History:  Procedure Laterality Date   CESAREAN SECTION  07/18/1999   CYST EXCISION     MASS EXCISION Right 09/19/2022   Procedure: EXCISION OF RIGHT SHOULDER MASS;  Surgeon: Rolm Bookbinder, MD;  Location: [facility];  Service: General;  Laterality: Right;  LMA   Family History  Problem Relation Age of Onset   Hypertension Mother    Celiac disease Son    Breast cancer Neg Hx    Social History   Social History Narrative   Kristine [name] lives with her son in [place]. 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    Allergies as of 10/17/2022       Reactions   Shellfish Allergy Anaphylaxis        Medication List        Accurate as of October 17, 2022  9:10 AM. If you have any questions, ask your nurse or doctor.          STOP taking these medications    PARoxetine 40 MG tablet Commonly known as:  PAXIL Stopped by: Howard Pouch, DO       TAKE these medications    ALPRAZolam 0.25 MG tablet Commonly known as: XANAX Take 1 tablet (0.25 mg total) by mouth at bedtime as needed for anxiety.   amLODipine 10 MG tablet Commonly known as: NORVASC Take 1 tablet (10 mg total) by mouth daily.   atorvastatin 20 MG tablet Commonly known as: LIPITOR Take 1 tablet (20 mg total) by mouth at bedtime.   buPROPion 150 MG 24 hr tablet Commonly known as: Wellbutrin XL Take 1 tablet (150 mg total) by mouth daily. Started by: Howard Pouch, DO   cetirizine 10 MG tablet Commonly known as: ZYRTEC Take 10 mg by mouth daily.        All past medical history, surgical history, allergies, family history, immunizations andmedications were updated in the EMR today and reviewed under the history and medication portions of their EMR.       No results found.   ROS 14 pt review of systems performed and negative (unless mentioned in an HPI)  Objective: BP 121/82   Pulse 83   Temp 98.4 F (36.9 C)   Wt 180 lb 9.6 oz (81.9 kg)   LMP 09/16/2022   SpO2 97%   BMI 31.99 kg/m  Physical Exam Vitals and nursing note reviewed.  Constitutional:      General: She is not in acute distress.    Appearance: Normal appearance. She is not ill-appearing, toxic-appearing or diaphoretic.  HENT:     Head: Normocephalic and atraumatic.  Eyes:     General: No scleral icterus.       Right eye: No discharge.        Left eye: No discharge.     Extraocular Movements: Extraocular movements intact.     Conjunctiva/sclera: Conjunctivae normal.     Pupils: Pupils are equal, round, and reactive to light.  Cardiovascular:     Rate and Rhythm: Normal rate and regular rhythm.  Pulmonary:     Effort: Pulmonary effort is normal. No respiratory distress.     Breath sounds: Normal breath sounds. No wheezing, rhonchi or rales.  Musculoskeletal:     Right lower leg: No edema.     Left lower leg: No edema.  Skin:     General: Skin is warm.     Findings: No rash.  Neurological:     Mental Status: She is alert and oriented to person, place, and time. Mental status is at baseline.     Motor: No weakness.     Gait: Gait normal.  Psychiatric:        Mood and Affect: Mood normal.        Behavior: Behavior normal.        Thought Content: Thought content normal.        Judgment: Judgment normal.      No results found.  Assessment/plan: Kristine [name] is a 42 y.o. female present for routine chronic condition management Essential hypertension/Hyperlipidemia LDL goal <130/on statin/obesity Stable Continue amlodipine 10 mg QD.  Continue Lipitor Labs due next visit - continue exercise and low salt diet.    Anxiety with depression/Panic attacks/sleep disturbance Stable DC Paxil back to 40 mg daily  Start wellbutrin qd Continue xanax prn- rarely needs. Refills provided today. [place] reviewed.  Tried meds: BuSpar- did not work well.  Paxil-worked well.  Pristiq-did not seem to work well since needing to continue to increase dose and add other meds.  Wellbutrin-may have worked okay but ran out.  Xanax-rarely needs.  Seroquel-madetoo sleepy even on 12.5 mg   Return in about 24 weeks (around 04/03/2023) for Routine chronic condition follow-up.    No orders of the defined types were placed in this encounter.  Meds ordered this encounter  Medications   ALPRAZolam (XANAX) 0.25 MG tablet    Sig: Take 1 tablet (0.25 mg total) by mouth at bedtime as needed for anxiety.    Dispense:  30 tablet    Refill:  2   amLODipine (NORVASC) 10 MG tablet    Sig: Take 1 tablet (10 mg total) by mouth daily.    Dispense:  90 tablet    Refill:  1   buPROPion (WELLBUTRIN XL) 150 MG 24 hr tablet    Sig: Take 1 tablet (150 mg total) by mouth daily.    Dispense:  90 tablet    Refill:  1   Referral Orders  No referral(s) requested today     Electronically signed by: Howard Pouch, [place]

## 2022-10-17 NOTE — Patient Instructions (Addendum)
Return in about 24 weeks (around 04/03/2023) for Routine chronic condition follow-up.        Great to see you today.  I have refilled the medication(s) we provide.   If labs were collected, we will inform you of lab results once received either by echart message or telephone call.   - echart message- for normal results that have been seen by the patient already.   - telephone call: abnormal results or if patient has not viewed results in their echart.

## 2022-11-20 ENCOUNTER — Encounter: Payer: Self-pay | Admitting: Family Medicine

## 2022-11-21 NOTE — Telephone Encounter (Signed)
Please encourage patient to make a virtual appointment and we can discuss increasing medication dose versus adding on another medication with Wellbutrin.  There are options to help her, which is need to find the best option for her.

## 2022-11-21 NOTE — Telephone Encounter (Signed)
No further action needed.

## 2022-11-22 ENCOUNTER — Ambulatory Visit: Payer: Medicaid Other | Admitting: Family Medicine

## 2022-11-22 ENCOUNTER — Encounter: Payer: Self-pay | Admitting: Family Medicine

## 2022-11-22 VITALS — BP 118/86 | HR 69 | Temp 98.0°F | Wt 181.6 lb

## 2022-11-22 DIAGNOSIS — F410 Panic disorder [episodic paroxysmal anxiety] without agoraphobia: Secondary | ICD-10-CM

## 2022-11-22 DIAGNOSIS — F418 Other specified anxiety disorders: Secondary | ICD-10-CM

## 2022-11-22 MED ORDER — DIAZEPAM 10 MG PO TABS: 10.0000 mg | ORAL_TABLET | Freq: Two times a day (BID) | ORAL | 5 refills | Status: DC | PRN

## 2022-11-22 MED ORDER — ESCITALOPRAM OXALATE 20 MG PO TABS: 20.0000 mg | ORAL_TABLET | Freq: Every day | ORAL | 1 refills | Status: DC

## 2022-11-22 NOTE — Patient Instructions (Signed)
No follow-ups on file.        Great to see you today.  I have refilled the medication(s) we provide.   If labs were collected, we will inform you of lab results once received either by echart message or telephone call.   - echart message- for normal results that have been seen by the patient already.   - telephone call: abnormal results or if patient has not viewed results in their echart.  

## 2022-11-22 NOTE — Progress Notes (Signed)
Patient ID: Kristine [name], female  DOB: 11/04/1980, 42 y.o.   MRN: [id number] Patient Care Team    Relationship Specialty Notifications Start End  Natalia Leatherwood, DO PCP - General Family Medicine  04/28/15    Comment: Patient request female provider    Chief Complaint  Patient presents with   Depression    Subjective: Kristine [name] is a 42 y.o.  Female  present to discuss her depression . Depression with anxiety/sleep disturbance:  Patient is having extreme amount of stress.  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 She has been needing to lean on the Xanax more frequently.  She is uncertain if the Wellbutrin made much of a difference since she is going through more of a stressful situation currently it is hard for her to gauge. Prior note: In the past she has been prescribed buspar and paxil. Buspar was not helpful- but she did well with paxil. She had not been prescribed meds in sometime (2017).  She had been attending therapy sessions and her therapist reccommended she see a psychiatrist. The psychiatrist started her on pristiq 100, wellbutrin 150 added later, then seroquel and xanax. She reports the majority of her symptoms are related to anxiety, but she endorses depression as well. She has panic attacks at times and uses the xanax prescribed by psych at those times (rarely needs. She endorses headache and shakiness yesterday- she assumed it was from abruptly stopping pristiq.   Documentation only: Hypertension/overweight/HLD:  Pt reports compliance with amlodipine 10 mg.  Patient denies chest pain, shortness of breath, dizziness or lower extremity edema.    Diet: not routinely.  RF: HTN, HLD, obesity ROS: See pertinent positives and negatives per HPI.    11/22/2022    7:43 AM 10/17/2022    8:56 AM 08/15/2022    8:37 AM 03/08/2022    9:21 AM 09/21/2021    2:04  PM  Depression screen PHQ 2/9  Decreased Interest 3 3 3  0 0  Down, Depressed, Hopeless 3 3 3  0 0  PHQ - 2 Score 6 6 6  0 0  Altered sleeping 2 1 2 1 1   Tired, decreased energy 2 2 3 1 1   Change in appetite 2 2 1 1 1   Feeling bad or failure about yourself  3 2 3  0 0  Trouble concentrating 2 0 0 0 0  Moving slowly or fidgety/restless 0 0 0 0 0  Suicidal thoughts 2 1 0 0 0  PHQ-9 Score 19 14 15 3 3   Difficult doing work/chores Extremely dIfficult          11/22/2022    7:43 AM 10/17/2022    8:58 AM 08/15/2022    8:38 AM 03/08/2022    9:21 AM  GAD 7 : Generalized Anxiety Score  Nervous, Anxious, on Edge 3 3 3 1   Control/stop worrying 3 2 3 1   Worry too much - different things 3 2 3 1   Trouble relaxing 2 0 2 1  Restless 0 0 0 1  Easily annoyed or irritable 3 2 2 1   Afraid - awful might happen 2 2 2  0  Total GAD 7 Score 16 11 15 6   Anxiety Difficulty Extremely difficult        Immunization History  Administered Date(s) Administered   Influenza Inj Mdck Quad Pf 04/13/2017  Influenza,inj,Quad PF,6+ Mos 05/02/2019   Influenza-Unspecified 04/17/2015, 04/16/2016, 04/05/2017   Tdap 07/21/2013, 03/02/2021    Past Medical History:  Diagnosis Date   Anxiety    Chicken pox    Depression    Frequent headaches    Hyperhidrosis    Hyperlipidemia    Hypertension    Allergies  Allergen Reactions   Shellfish Allergy Anaphylaxis   Past Surgical History:  Procedure Laterality Date   CESAREAN SECTION  07/18/1999   CYST EXCISION     MASS EXCISION Right 09/19/2022   Procedure: EXCISION OF RIGHT SHOULDER MASS;  Surgeon: Emelia Loron, MD;  Location: [facility];  Service: General;  Laterality: Right;  LMA   Family History  Problem Relation Age of Onset   Hypertension Mother    Celiac disease Son    Breast cancer Neg Hx    Social History   Social History Narrative   [name] lives with her son in [place]. 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     Allergies as of 11/22/2022       Reactions   Shellfish Allergy Anaphylaxis        Medication List        Accurate as of Nov 22, 2022  7:54 AM. If you have any questions, ask your nurse or doctor.          ALPRAZolam 0.25 MG tablet Commonly known as: XANAX Take 1 tablet (0.25 mg total) by mouth at bedtime as needed for anxiety.   amLODipine 10 MG tablet Commonly known as: NORVASC Take 1 tablet (10 mg total) by mouth daily.   atorvastatin 20 MG tablet Commonly known as: LIPITOR Take 1 tablet (20 mg total) by mouth at bedtime.   buPROPion 150 MG 24 hr tablet Commonly known as: Wellbutrin XL Take 1 tablet (150 mg total) by mouth daily.   cetirizine 10 MG tablet Commonly known as: ZYRTEC Take 10 mg by mouth daily.        All past medical history, surgical history, allergies, family history, immunizations andmedications were updated in the EMR today and reviewed under the history and medication portions of their EMR.       No results found.   ROS 14 pt review of systems performed and negative (unless mentioned in an HPI)  Objective: BP 118/86   Pulse 69   Temp 98 F (36.7 C)   Wt 181 lb 9.6 oz (82.4 kg)   LMP 11/16/2022   SpO2 98%   BMI 32.17 kg/m  Physical Exam Vitals and nursing note reviewed.  Constitutional:      General: She is not in acute distress.    Appearance: Normal appearance. She is not ill-appearing, toxic-appearing or diaphoretic.  HENT:     Head: Normocephalic and atraumatic.  Eyes:     General: No scleral icterus.       Right eye: No discharge.        Left eye: No discharge.     Extraocular Movements: Extraocular movements intact.     Conjunctiva/sclera: Conjunctivae normal.     Pupils: Pupils are equal, round, and reactive to light.  Cardiovascular:     Rate and Rhythm: Normal rate and regular rhythm.  Pulmonary:     Effort: Pulmonary effort is normal. No respiratory distress.     Breath sounds: Normal breath sounds. No  wheezing, rhonchi or rales.  Musculoskeletal:     Right lower leg: No edema.     Left lower  leg: No edema.  Skin:    General: Skin is warm.     Findings: No rash.  Neurological:     Mental Status: She is alert and oriented to person, place, and time. Mental status is at baseline.     Motor: No weakness.     Gait: Gait normal.  Psychiatric:        Mood and Affect: Mood normal.        Behavior: Behavior normal.        Thought Content: Thought content normal.        Judgment: Judgment normal.      No results found.  Assessment/plan: Kristine [name] is a 42 y.o. female present for routine chronic condition management Anxiety with depression/Panic attacks/sleep disturbance Patient has history of depression and anxiety, with mostly anxiety features.  However she is going through a very difficult time in her life right now causing her to feel overwhelmed and anxious.  We discussed different options for treatment plan, she has been on different medications in the past without great benefit.  Reviewed all prior meds used with her today and we decided on the following: DC wellbutrin qd DC Xanax Start Lexapro 20 mg daily Start Valium 5-10 mg twice daily Continue xanax prn- rarely needs. Refills provided today. [facility] database reviewed.  Tried meds: BuSpar- did not work well.  Paxil-weight gain.  Pristiq-did not seem to work well. Wellbutrin-did not work well.  Xanax-rarely needs.  Seroquel-made too sleepy even on 12.5 mg  (Documentation only) Essential hypertension/Hyperlipidemia LDL goal <130/on statin/obesity Stable Continue amlodipine 10 mg QD.  Continue Lipitor Labs due next visit - continue exercise and low salt diet.   No follow-ups on file.    No orders of the defined types were placed in this encounter.  No orders of the defined types were placed in this encounter.  Referral Orders  No referral(s) requested today     Electronically signed by: Felix Pacini, DO [facility] [place]

## 2022-12-04 ENCOUNTER — Encounter: Payer: Self-pay | Admitting: Family Medicine

## 2022-12-20 ENCOUNTER — Encounter: Payer: Self-pay | Admitting: Family Medicine

## 2022-12-20 ENCOUNTER — Telehealth (INDEPENDENT_AMBULATORY_CARE_PROVIDER_SITE_OTHER): Payer: Medicaid Other | Admitting: Family Medicine

## 2022-12-20 DIAGNOSIS — F418 Other specified anxiety disorders: Secondary | ICD-10-CM

## 2022-12-20 DIAGNOSIS — F410 Panic disorder [episodic paroxysmal anxiety] without agoraphobia: Secondary | ICD-10-CM

## 2022-12-20 NOTE — Patient Instructions (Signed)
No follow-ups on file.        Great to see you today.  I have refilled the medication(s) we provide.   If labs were collected, we will inform you of lab results once received either by echart message or telephone call.   - echart message- for normal results that have been seen by the patient already.   - telephone call: abnormal results or if patient has not viewed results in their echart.  

## 2022-12-20 NOTE — Progress Notes (Signed)
VIRTUAL VISIT VIA VIDEO  I connected with Kristine [name] on 12/20/22 at  8:40 AM EDT by a video enabled telemedicine application and verified that I am speaking with the correct person using two identifiers. Location patient: Home Location provider: [facility], Office Persons participating in the virtual visit: Patient, Dr. Claiborne Billings and Ivonne Andrew, CMA  I discussed the limitations of evaluation and management by telemedicine and the availability of in person appointments. The patient expressed understanding and agreed to proceed.    Patient ID: Kristine [name], female  DOB: [birth year], 42 y.o.   MRN: [id number] Patient Care Team    Relationship Specialty Notifications Start End  Natalia Leatherwood, DO PCP - General Family Medicine  04/28/15    Comment: Patient request female provider    Chief Complaint  Patient presents with   Anxiety    Subjective: Kristine [name] is a 42 y.o.  Female  present to discuss her depression . Depression with anxiety/sleep disturbance:  Pt reports she feels much improved and less stressed. She is still having many stressors currently but she feels she is handling them better now on new medications. Compliance with lexapro 20 mg qd and valium 5-10 mg bid) Prior note: Patient is having extreme amount of stress.  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 She has been needing to lean on the Xanax more frequently.  She is uncertain if the Wellbutrin made much of a difference since she is going through more of a stressful situation currently it is hard for her to gauge. Prior note: In the past she has been prescribed buspar and paxil. Buspar was not helpful- but she did well with paxil. She had not been prescribed meds in sometime (2017).  She had been attending therapy sessions and her therapist reccommended she see a psychiatrist. The  psychiatrist started her on pristiq 100, wellbutrin 150 added later, then seroquel and xanax. She reports the majority of her symptoms are related to anxiety, but she endorses depression as well. She has panic attacks at times and uses the xanax prescribed by psych at those times (rarely needs. She endorses headache and shakiness yesterday- she assumed it was from abruptly stopping pristiq.   Documentation only: Hypertension/overweight/HLD:  Pt reports compliance with amlodipine 10 mg.  Patient denies chest pain, shortness of breath, dizziness or lower extremity edema.    Diet: not routinely.  RF: HTN, HLD, obesity ROS: See pertinent positives and negatives per HPI.    12/20/2022    8:28 AM 11/22/2022    7:43 AM 10/17/2022    8:56 AM 08/15/2022    8:37 AM 03/08/2022    9:21 AM  Depression screen PHQ 2/9  Decreased Interest 0 3 3 3  0  Down, Depressed, Hopeless 0 3 3 3  0  PHQ - 2 Score 0 6 6 6  0  Altered sleeping  2 1 2 1   Tired, decreased energy  2 2 3 1   Change in appetite  2 2 1 1   Feeling bad or failure about yourself   3 2 3  0  Trouble concentrating  2 0 0 0  Moving slowly or fidgety/restless  0 0 0 0  Suicidal thoughts  2 1 0 0  PHQ-9 Score  19 14 15 3   Difficult doing work/chores  Extremely dIfficult  12/20/2022    8:27 AM 11/22/2022    7:43 AM 10/17/2022    8:58 AM 08/15/2022    8:38 AM  GAD 7 : Generalized Anxiety Score  Nervous, Anxious, on Edge 2 3 3 3   Control/stop worrying 2 3 2 3   Worry too much - different things 1 3 2 3   Trouble relaxing 0 2 0 2  Restless 0 0 0 0  Easily annoyed or irritable 1 3 2 2   Afraid - awful might happen 0 2 2 2   Total GAD 7 Score 6 16 11 15   Anxiety Difficulty Not difficult at all Extremely difficult       Immunization History  Administered Date(s) Administered   Influenza Inj Mdck Quad Pf 04/13/2017   Influenza,inj,Quad PF,6+ Mos 05/02/2019   Influenza-Unspecified 04/17/2015, 04/16/2016, 04/05/2017   Tdap 07/21/2013, 03/02/2021     Past Medical History:  Diagnosis Date   Anxiety    Chicken pox    Depression    Frequent headaches    Hyperhidrosis    Hyperlipidemia    Hypertension    Allergies  Allergen Reactions   Shellfish Allergy Anaphylaxis   Past Surgical History:  Procedure Laterality Date   CESAREAN SECTION  07/18/1999   CYST EXCISION     MASS EXCISION Right 09/19/2022   Procedure: EXCISION OF RIGHT SHOULDER MASS;  Surgeon: Emelia Loron, MD;  Location: [facility];  Service: General;  Laterality: Right;  LMA   Family History  Problem Relation Age of Onset   Hypertension Mother    Celiac disease Son    Breast cancer Neg Hx    Social History   Social History Narrative   Ms. Melberg lives with her son in [place]. 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    Allergies as of 12/20/2022       Reactions   Shellfish Allergy Anaphylaxis        Medication List        Accurate as of December 20, 2022 10:23 AM. If you have any questions, ask your nurse or doctor.          amLODipine 10 MG tablet Commonly known as: NORVASC Take 1 tablet (10 mg total) by mouth daily.   atorvastatin 20 MG tablet Commonly known as: LIPITOR Take 1 tablet (20 mg total) by mouth at bedtime.   cetirizine 10 MG tablet Commonly known as: ZYRTEC Take 10 mg by mouth daily.   diazepam 10 MG tablet Commonly known as: VALIUM Take 1 tablet (10 mg total) by mouth every 12 (twelve) hours as needed for anxiety.   escitalopram 20 MG tablet Commonly known as: LEXAPRO Take 1 tablet (20 mg total) by mouth daily.        All past medical history, surgical history, allergies, family history, immunizations andmedications were updated in the EMR today and reviewed under the history and medication portions of their EMR.       No results found.   ROS 14 pt review of systems performed and negative (unless mentioned in an HPI)  Objective: LMP 11/16/2022  Physical Exam Vitals and nursing  note reviewed.  Constitutional:      General: She is not in acute distress.    Appearance: Normal appearance. She is not ill-appearing, toxic-appearing or diaphoretic.  HENT:     Head: Normocephalic and atraumatic.  Eyes:     General: No scleral icterus.       Right eye: No discharge.  Left eye: No discharge.     Extraocular Movements: Extraocular movements intact.     Conjunctiva/sclera: Conjunctivae normal.     Pupils: Pupils are equal, round, and reactive to light.  Cardiovascular:     Rate and Rhythm: Normal rate and regular rhythm.  Pulmonary:     Effort: Pulmonary effort is normal. No respiratory distress.     Breath sounds: Normal breath sounds. No wheezing, rhonchi or rales.  Musculoskeletal:     Right lower leg: No edema.     Left lower leg: No edema.  Skin:    General: Skin is warm.     Findings: No rash.  Neurological:     Mental Status: She is alert and oriented to person, place, and time. Mental status is at baseline.     Motor: No weakness.     Gait: Gait normal.  Psychiatric:        Mood and Affect: Mood normal.        Behavior: Behavior normal.        Thought Content: Thought content normal.        Judgment: Judgment normal.      No results found.  Assessment/plan: Kristine [name] is a 42 y.o. female present for routine chronic condition management Anxiety with depression/Panic attacks/sleep disturbance Much improved Continue  Lexapro 20 mg daily Continue  Valium 5-10 mg twice daily (or 5 mg q8) to attempt to decrease sedation affects Tried meds: BuSpar- did not work well.  Paxil-weight gain.  Pristiq-did not seem to work well. Wellbutrin-did not work well.  Xanax-rarely needs.  Seroquel-made too sleepy even on 12.5 mg Pt has [facility] appt schedule this summer  (Documentation only) Essential hypertension/Hyperlipidemia LDL goal <130/on statin/obesity Stable Continue amlodipine 10 mg QD.  Continue Lipitor Labs due next visit - continue exercise and  low salt diet.       No orders of the defined types were placed in this encounter.  No orders of the defined types were placed in this encounter.  Referral Orders  No referral(s) requested today     Electronically signed by: Felix Pacini, DO [facility] [place]

## 2023-01-09 ENCOUNTER — Encounter: Payer: Self-pay | Admitting: Family Medicine

## 2023-03-01 ENCOUNTER — Encounter (INDEPENDENT_AMBULATORY_CARE_PROVIDER_SITE_OTHER): Payer: Self-pay

## 2023-03-18 ENCOUNTER — Encounter: Payer: Self-pay | Admitting: Family Medicine

## 2023-03-20 MED ORDER — ATORVASTATIN CALCIUM 20 MG PO TABS: 20.0000 mg | ORAL_TABLET | Freq: Every day | ORAL | 0 refills | Status: DC

## 2023-03-20 NOTE — Telephone Encounter (Signed)
No further action needed.

## 2023-03-22 DIAGNOSIS — M25562 Pain in left knee: Secondary | ICD-10-CM | POA: Diagnosis not present

## 2023-03-22 DIAGNOSIS — M1712 Unilateral primary osteoarthritis, left knee: Secondary | ICD-10-CM | POA: Diagnosis not present

## 2023-03-22 DIAGNOSIS — M25462 Effusion, left knee: Secondary | ICD-10-CM | POA: Diagnosis not present

## 2023-03-25 DIAGNOSIS — G8911 Acute pain due to trauma: Secondary | ICD-10-CM | POA: Diagnosis not present

## 2023-03-25 DIAGNOSIS — S0990XA Unspecified injury of head, initial encounter: Secondary | ICD-10-CM | POA: Diagnosis not present

## 2023-03-25 DIAGNOSIS — S0003XA Contusion of scalp, initial encounter: Secondary | ICD-10-CM | POA: Diagnosis not present

## 2023-03-25 DIAGNOSIS — R296 Repeated falls: Secondary | ICD-10-CM | POA: Diagnosis not present

## 2023-03-26 ENCOUNTER — Encounter: Payer: Self-pay | Admitting: Family Medicine

## 2023-03-26 NOTE — Telephone Encounter (Signed)
No further action needed at this time.

## 2023-03-27 NOTE — Telephone Encounter (Signed)
She is feeling okay now, she can wait till her regular scheduled appointment next week to be seen.

## 2023-04-03 ENCOUNTER — Ambulatory Visit: Payer: Medicaid Other | Admitting: Family Medicine

## 2023-04-03 ENCOUNTER — Encounter: Payer: Self-pay | Admitting: Family Medicine

## 2023-04-03 VITALS — BP 117/80 | HR 63 | Temp 98.6°F | Wt 181.8 lb

## 2023-04-03 DIAGNOSIS — G479 Sleep disorder, unspecified: Secondary | ICD-10-CM | POA: Diagnosis not present

## 2023-04-03 DIAGNOSIS — F410 Panic disorder [episodic paroxysmal anxiety] without agoraphobia: Secondary | ICD-10-CM

## 2023-04-03 DIAGNOSIS — I10 Essential (primary) hypertension: Secondary | ICD-10-CM | POA: Diagnosis not present

## 2023-04-03 DIAGNOSIS — E785 Hyperlipidemia, unspecified: Secondary | ICD-10-CM | POA: Diagnosis not present

## 2023-04-03 DIAGNOSIS — M25562 Pain in left knee: Secondary | ICD-10-CM

## 2023-04-03 DIAGNOSIS — F418 Other specified anxiety disorders: Secondary | ICD-10-CM

## 2023-04-03 MED ORDER — ESCITALOPRAM OXALATE 20 MG PO TABS: 20.0000 mg | ORAL_TABLET | Freq: Every day | ORAL | 1 refills | Status: DC

## 2023-04-03 MED ORDER — CLONAZEPAM 0.5 MG PO TABS: 0.5000 mg | ORAL_TABLET | Freq: Every day | ORAL | 5 refills | Status: DC

## 2023-04-03 MED ORDER — AMLODIPINE BESYLATE 10 MG PO TABS: 10.0000 mg | ORAL_TABLET | Freq: Every day | ORAL | 1 refills | Status: DC

## 2023-04-03 MED ORDER — ATORVASTATIN CALCIUM 20 MG PO TABS: 20.0000 mg | ORAL_TABLET | Freq: Every day | ORAL | 3 refills | Status: DC

## 2023-04-03 NOTE — Progress Notes (Signed)
Patient ID: Kristine [name], female  DOB: 1981/04/15, 42 y.o.   MRN: [id number] Patient Care Team    Relationship Specialty Notifications Start End  Natalia Leatherwood, DO PCP - General Family Medicine  04/28/15    Comment: Patient request female provider    Chief Complaint  Patient presents with   Hypertension    Subjective: Kristine [name] is a 42 y.o.  Female  present to discuss her depression . Depression with anxiety/sleep disturbance:  Reports compliance with lexapro 20 mg qd and valium 5-10 mg bid).  She reports she is feeling regimen.  She still having some increased anxiety but cannot increase the Valium secondary to sedation. Prior note: Patient is having extreme amount of stress.  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 She has been needing to lean on the Xanax more frequently.  She is uncertain if the Wellbutrin made much of a difference since she is going through more of a stressful situation currently it is hard for her to gauge. Prior note: In the past she has been prescribed buspar and paxil. Buspar was not helpful- but she did well with paxil. She had not been prescribed meds in sometime (2017).  She had been attending therapy sessions and her therapist reccommended she see a psychiatrist. The psychiatrist started her on pristiq 100, wellbutrin 150 added later, then seroquel and xanax. She reports the majority of her symptoms are related to anxiety, but she endorses depression as well. She has panic attacks at times and uses the xanax prescribed by psych at those times (rarely needs. She endorses headache and shakiness yesterday- she assumed it was from abruptly stopping pristiq.   Hypertension/overweight/HLD:  Pt reports compliance with amlodipine 10 mg.  Patient denies chest pain, shortness of breath, dizziness or lower extremity edema.  Diet: not  routinely.  RF: HTN, HLD, obesity ROS: See pertinent positives and negatives per HPI.    04/03/2023    8:12 AM 12/20/2022    8:28 AM 11/22/2022    7:43 AM 10/17/2022    8:56 AM 08/15/2022    8:37 AM  Depression screen PHQ 2/9  Decreased Interest 1 0 3 3 3   Down, Depressed, Hopeless 2 0 3 3 3   PHQ - 2 Score 3 0 6 6 6   Altered sleeping 2  2 1 2   Tired, decreased energy 2  2 2 3   Change in appetite 1  2 2 1   Feeling bad or failure about yourself  2  3 2 3   Trouble concentrating 2  2 0 0  Moving slowly or fidgety/restless 2  0 0 0  Suicidal thoughts 0  2 1 0  PHQ-9 Score 14  19 14 15   Difficult doing work/chores Somewhat difficult  Extremely dIfficult        04/03/2023    8:12 AM 12/20/2022    8:27 AM 11/22/2022    7:43 AM 10/17/2022    8:58 AM  GAD 7 : Generalized Anxiety Score  Nervous, Anxious, on Edge 1 2 3 3   Control/stop worrying 2 2 3 2   Worry too much - different things 2 1 3 2   Trouble relaxing 1 0 2 0  Restless 2 0 0 0  Easily annoyed or irritable 1 1 3 2   Afraid - awful might happen 1 0 2 2  Total GAD 7 Score 10 6 16 11   Anxiety Difficulty Somewhat difficult Not difficult at all Extremely difficult      Immunization History  Administered Date(s) Administered   Influenza Inj Mdck Quad Pf 04/13/2017   Influenza,inj,Quad PF,6+ Mos 05/02/2019   Influenza-Unspecified 04/17/2015, 04/16/2016, 04/05/2017   Tdap 07/21/2013, 03/02/2021    Past Medical History:  Diagnosis Date   Anxiety    Chicken pox    Depression    Frequent headaches    Hyperhidrosis    Hyperlipidemia    Hypertension    Allergies  Allergen Reactions   Shellfish Allergy Anaphylaxis   Past Surgical History:  Procedure Laterality Date   CESAREAN SECTION  07/18/1999   CYST EXCISION     MASS EXCISION Right 09/19/2022   Procedure: EXCISION OF RIGHT SHOULDER MASS;  Surgeon: Emelia Loron, MD;  Location: [facility];  Service: General;  Laterality: Right;  LMA   Family History   Problem Relation Age of Onset   Hypertension Mother    Celiac disease Son    Breast cancer Neg Hx    Social History   Social History Narrative   Kristine [name] lives with her son in [place]. 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    Allergies as of 04/03/2023       Reactions   Shellfish Allergy Anaphylaxis        Medication List        Accurate as of April 03, 2023  1:08 PM. If you have any questions, ask your nurse or doctor.          STOP taking these medications    diazepam 10 MG tablet Commonly known as: VALIUM Stopped by: Felix Pacini       TAKE these medications    amLODipine 10 MG tablet Commonly known as: NORVASC Take 1 tablet (10 mg total) by mouth daily.   atorvastatin 20 MG tablet Commonly known as: LIPITOR Take 1 tablet (20 mg total) by mouth at bedtime. What changed: Another medication with the same name was changed. Make sure you understand how and when to take each. Changed by: Felix Pacini   atorvastatin 20 MG tablet Commonly known as: LIPITOR Take 1 tablet (20 mg total) by mouth daily. What changed: additional instructions Changed by: Felix Pacini   cetirizine 10 MG tablet Commonly known as: ZYRTEC Take 10 mg by mouth daily.   clonazePAM 0.5 MG tablet Commonly known as: KLONOPIN Take 1 tablet (0.5 mg total) by mouth at bedtime. Started by: Felix Pacini   escitalopram 20 MG tablet Commonly known as: LEXAPRO Take 1 tablet (20 mg total) by mouth daily.        All past medical history, surgical history, allergies, family history, immunizations andmedications were updated in the EMR today and reviewed under the history and medication portions of their EMR.       No results found.   ROS 14 pt review of systems performed and negative (unless mentioned in an HPI)  Objective: BP 117/80   Pulse 63   Temp 98.6 F (37 C)   Wt 181 lb 12.8 oz (82.5 kg)   LMP 04/02/2023   SpO2 96%   BMI 32.20 kg/m  Physical  Exam Vitals and nursing note reviewed.  Constitutional:      General: She is not in acute distress.    Appearance: Normal appearance. She is not ill-appearing, toxic-appearing or diaphoretic.  HENT:     Head: Normocephalic and atraumatic.  Eyes:     General: No scleral icterus.       Right eye: No discharge.        Left eye: No discharge.     Extraocular Movements: Extraocular movements intact.     Conjunctiva/sclera: Conjunctivae normal.     Pupils: Pupils are equal, round, and reactive to light.  Cardiovascular:     Rate and Rhythm: Normal rate and regular rhythm.  Pulmonary:     Effort: Pulmonary effort is normal. No respiratory distress.     Breath sounds: Normal breath sounds. No wheezing, rhonchi or rales.  Musculoskeletal:     Right lower leg: No edema.     Left lower leg: No edema.  Skin:    General: Skin is warm.     Findings: No rash.  Neurological:     Mental Status: She is alert and oriented to person, place, and time. Mental status is at baseline.     Motor: No weakness.     Gait: Gait normal.  Psychiatric:        Mood and Affect: Mood normal.        Behavior: Behavior normal.        Thought Content: Thought content normal.        Judgment: Judgment normal.      No results found.  Assessment/plan: MIKESHA COMP is a 42 y.o. female present for routine chronic condition management Anxiety with depression/Panic attacks/sleep disturbance Could use more coverage Continue Lexapro 20 mg daily Discontinue Valium secondary to sedation at higher doses needed.  Start Klonopin 0.5 mg twice daily in its place. Tried meds: BuSpar- did not work well.  Paxil-weight gain.  Pristiq-did not seem to work well. Wellbutrin-did not work well.  Xanax-rarely needs.  Seroquel-made too sleepy even on 12.5 mg   Essential hypertension/Hyperlipidemia LDL goal <130/on statin/obesity Stable Continue amlodipine 10 mg QD.  Continue Lipitor.   Labs up-to-date 08/15/2022 - continue  exercise and low salt diet.     Return in about 24 weeks (around 09/18/2023).   Orders Placed This Encounter  Procedures   Ambulatory referral to Orthopedic Surgery   Meds ordered this encounter  Medications   amLODipine (NORVASC) 10 MG tablet    Sig: Take 1 tablet (10 mg total) by mouth daily.    Dispense:  90 tablet    Refill:  1   atorvastatin (LIPITOR) 20 MG tablet    Sig: Take 1 tablet (20 mg total) by mouth daily.    Dispense:  90 tablet    Refill:  3   escitalopram (LEXAPRO) 20 MG tablet    Sig: Take 1 tablet (20 mg total) by mouth daily.    Dispense:  90 tablet    Refill:  1   clonazePAM (KLONOPIN) 0.5 MG tablet    Sig: Take 1 tablet (0.5 mg total) by mouth at bedtime.    Dispense:  60 tablet    Refill:  5    Dc valium   Referral Orders         Ambulatory referral to Orthopedic Surgery       Electronically signed by: Felix Pacini, DO [facility] [place]

## 2023-04-03 NOTE — Patient Instructions (Addendum)
Return in about 24 weeks (around 09/18/2023).        Great to see you today.  I have refilled the medication(s) we provide.   If labs were collected or images ordered, we will inform you of  results once we have received them and reviewed. We will contact you either by echart message, or telephone call.  Please give ample time to the testing facility, and our office to run,  receive and review results. Please do not call inquiring of results, even if you can see them in your chart. We will contact you as soon as we are able. If it has been over 1 week since the test was completed, and you have not yet heard from [place], then please call us.    - echart message- for normal results that have been seen by the patient already.   - telephone call: abnormal results or if patient has not viewed results in their echart.  If a referral to a specialist was entered for you, please call us in 2 weeks if you have not heard from the specialist office to schedule.    If you are interested in weight loss counseling please make appt to discuss and bring with you 2 weeks of a food diary/log on notebook paper.  Food log is mandatory on first appt to proceed with counseling.  Weight loss counseling encompasses diet, exercise and can include  medications when appropriate and affordable.  There are routine appts for check-ins and weights to track progress and keep you on track.  Routine check-ins (in person) are also mandatory to continue with prescription refills. Check-in timeline  can range from 4 weeks to 12 weeks, depending on physician's recommendations and which step you are in of your weight loss journey.  Your BMI today is Body mass index is 32.2 kg/m.  Please check with your insurance prior to appt and ask them if they cover weight loss medications for your BMI? And if so, which medications. They may tell you some of the diabetes meds that are used for weight loss also,  are on your formulary- but this  does not mean they are covered for weight loss only.  Even if they tell with a prior auth it is covered- make sure they check to see if you personally meet criteria with your BMI.

## 2023-04-18 ENCOUNTER — Ambulatory Visit: Payer: Medicaid Other | Admitting: Family Medicine

## 2023-04-18 DIAGNOSIS — M25562 Pain in left knee: Secondary | ICD-10-CM | POA: Diagnosis not present

## 2023-04-19 ENCOUNTER — Encounter: Payer: Self-pay | Admitting: Family Medicine

## 2023-04-19 ENCOUNTER — Ambulatory Visit: Payer: Medicaid Other | Admitting: Family Medicine

## 2023-04-19 VITALS — BP 104/80 | HR 70 | Temp 98.1°F | Ht 64.0 in | Wt 182.6 lb

## 2023-04-19 DIAGNOSIS — E661 Drug-induced obesity: Secondary | ICD-10-CM

## 2023-04-19 DIAGNOSIS — E785 Hyperlipidemia, unspecified: Secondary | ICD-10-CM | POA: Diagnosis not present

## 2023-04-19 DIAGNOSIS — I10 Essential (primary) hypertension: Secondary | ICD-10-CM | POA: Diagnosis not present

## 2023-04-19 DIAGNOSIS — Z6831 Body mass index (BMI) 31.0-31.9, adult: Secondary | ICD-10-CM

## 2023-04-19 DIAGNOSIS — E66811 Obesity, class 1: Secondary | ICD-10-CM

## 2023-04-19 MED ORDER — ZEPBOUND 5 MG/0.5ML ~~LOC~~ SOAJ: 5.0000 mg | SUBCUTANEOUS | 0 refills | Status: DC

## 2023-04-19 MED ORDER — ONDANSETRON 4 MG PO TBDP: 4.0000 mg | ORAL_TABLET | Freq: Three times a day (TID) | ORAL | 0 refills | Status: DC | PRN

## 2023-04-19 MED ORDER — ZEPBOUND 2.5 MG/0.5ML ~~LOC~~ SOAJ: 2.5000 mg | SUBCUTANEOUS | 0 refills | Status: DC

## 2023-04-19 NOTE — Progress Notes (Signed)
Kristine [name] , [birth year], 42 y.o., female MRN: [id number] Patient Care Team    Relationship Specialty Notifications Start End  Kristine Leatherwood, DO PCP - General Family Medicine  04/28/15    Comment: Patient request female provider    Chief Complaint  Patient presents with   Obesity     Subjective: Kristine [name] is a 42 y.o. Pt presents for an OV to discuss nutrition, obesity and weight loss.  Patient diagnosed with morbid obesity-comorbid conditions of hypertension and hyperlipidemia.  Wt/lbs: 182 5 LBS BMI: 31.34 Diet: Patient brings her food diary in with her today.  This was reviewed with her in person. Calories: Currently does not count calories Exercise: Currently does not participate in exercise Water: Drinks about 1 bottle of water daily, soda and coffee the rest of the day Barriers: She does have a left knee meniscal tear which she is going to make it more difficult for her to obtain cardiovascular exercise to burn calories.      04/19/2023    9:11 AM 04/03/2023    8:12 AM 12/20/2022    8:28 AM 11/22/2022    7:43 AM 10/17/2022    8:56 AM  Depression screen PHQ 2/9  Decreased Interest 1 1 0 3 3  Down, Depressed, Hopeless 2 2 0 3 3  PHQ - 2 Score 3 3 0 6 6  Altered sleeping 1 2  2 1   Tired, decreased energy 1 2  2 2   Change in appetite 2 1  2 2   Feeling bad or failure about yourself  3 2  3 2   Trouble concentrating 2 2  2  0  Moving slowly or fidgety/restless 0 2  0 0  Suicidal thoughts 0 0  2 1  PHQ-9 Score 12 14  19 14   Difficult doing work/chores Somewhat difficult Somewhat difficult  Extremely dIfficult     Allergies  Allergen Reactions   Shellfish Allergy Anaphylaxis   Social History   Social History Narrative   Kristine [name] lives with her son in [place]. 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   Past Medical History:  Diagnosis Date   Anxiety    Chicken pox    Depression    Frequent headaches    Hyperhidrosis    Hyperlipidemia     Hypertension    Past Surgical History:  Procedure Laterality Date   CESAREAN SECTION  07/18/1999   CYST EXCISION     MASS EXCISION Right 09/19/2022   Procedure: EXCISION OF RIGHT SHOULDER MASS;  Surgeon: Emelia Loron, MD;  Location: [facility];  Service: General;  Laterality: Right;  LMA   Family History  Problem Relation Age of Onset   Hypertension Mother    Celiac disease Son    Breast cancer Neg Hx    Allergies as of 04/19/2023       Reactions   Shellfish Allergy Anaphylaxis        Medication List        Accurate as of April 19, 2023 11:53 AM. If you have any questions, ask your nurse or doctor.          amLODipine 10 MG tablet Commonly known as: NORVASC Take 1 tablet (10 mg total) by mouth daily.   atorvastatin 20 MG tablet Commonly known as: LIPITOR Take 1 tablet (20 mg total) by mouth daily. What changed: Another medication with the same name was removed. Continue taking this medication, and follow  the directions you see here. Changed by: Felix Pacini   cetirizine 10 MG tablet Commonly known as: ZYRTEC Take 10 mg by mouth daily.   clonazePAM 0.5 MG tablet Commonly known as: KLONOPIN Take 1 tablet (0.5 mg total) by mouth at bedtime.   escitalopram 20 MG tablet Commonly known as: LEXAPRO Take 1 tablet (20 mg total) by mouth daily.   ondansetron 4 MG disintegrating tablet Commonly known as: ZOFRAN-ODT Take 1 tablet (4 mg total) by mouth every 8 (eight) hours as needed for nausea or vomiting. Started by: Felix Pacini   Zepbound 2.5 MG/0.5ML Pen Generic drug: tirzepatide Inject 2.5 mg into the skin once a week. Started by: Felix Pacini   Zepbound 5 MG/0.5ML Pen Generic drug: tirzepatide Inject 5 mg into the skin once a week. Start taking on: May 18, 2023 Started by: Felix Pacini        All past medical history, surgical history, allergies, family history, immunizations andmedications were updated in the EMR today  and reviewed under the history and medication portions of their EMR.     ROS Negative, with the exception of above mentioned in HPI   Objective:  BP 104/80   Pulse 70   Temp 98.1 F (36.7 C)   Ht 5\' 4"  (1.626 m)   Wt 182 lb 9.6 oz (82.8 kg)   LMP 04/02/2023   SpO2 97%   BMI 31.34 kg/m  Body mass index is 31.34 kg/m. Physical Exam Vitals and nursing note reviewed.  Constitutional:      General: She is not in acute distress.    Appearance: Normal appearance. She is normal weight. She is not ill-appearing or toxic-appearing.  HENT:     Head: Normocephalic and atraumatic.  Eyes:     General: No scleral icterus.       Right eye: No discharge.        Left eye: No discharge.     Extraocular Movements: Extraocular movements intact.     Conjunctiva/sclera: Conjunctivae normal.     Pupils: Pupils are equal, round, and reactive to light.  Skin:    Findings: No rash.  Neurological:     Mental Status: She is alert and oriented to person, place, and time. Mental status is at baseline.     Motor: No weakness.     Coordination: Coordination normal.     Gait: Gait normal.  Psychiatric:        Mood and Affect: Mood normal.        Behavior: Behavior normal.        Thought Content: Thought content normal.        Judgment: Judgment normal.     No results found. No results found. No results found for this or any previous visit (from the past 24 hour(s)).  Assessment/Plan: Kristine [name] is a 42 y.o. 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  Applications for calorie counting.  Patient was advised to ensure she is taking in adequate nutrition daily by meeting calorie  goals. -Patient was educated on dietary changes to not only lose weight but to eat healthy.   -Patient was educated on low glycemic index foods and lean proteins. -Patient was educated on exercise goal of 150  minutes a week of cardiovascular exercise with goal to obtain optimal heart rate to reach cardiovascular/fat burning zones. -Patient was encouraged to maintain adequate water consumption of at least 80-100 ounces a day. We discussed medications to help her in her weight loss journey and she would like to try Zepbound.  Instructions were provided to her.  She was advised to make a nurse visit for tutorial on proper injection technique when she picks up her medication. Start Zepbound 2.5 weekly injection for 4 weeks, then increase to Zepbound 5 mg weekly injection. Zofran prescribed in the event of any nausea Follow-up in 6 weeks    Reviewed expectations re: course of current medical issues. Discussed self-management of symptoms. Outlined signs and symptoms indicating need for more acute intervention. Patient verbalized understanding and all questions were answered. Patient received an After-Visit Summary.    No orders of the defined types were placed in this encounter.  Meds ordered this encounter  Medications   ZEPBOUND 2.5 MG/0.5ML Pen    Sig: Inject 2.5 mg into the skin once a week.    Dispense:  2 mL    Refill:  0   ondansetron (ZOFRAN-ODT) 4 MG disintegrating tablet    Sig: Take 1 tablet (4 mg total) by mouth every 8 (eight) hours as needed for nausea or vomiting.    Dispense:  20 tablet    Refill:  0   ZEPBOUND 5 MG/0.5ML Pen    Sig: Inject 5 mg into the skin once a week.    Dispense:  2 mL    Refill:  0   Referral Orders  No referral(s) requested today     Note is dictated utilizing voice recognition software. Although note has been proof read prior to signing, occasional typographical errors still can be missed. If any questions arise, please do not hesitate to  call for verification.   electronically signed by:  Felix Pacini, DO  [facility] [place]

## 2023-04-19 NOTE — Patient Instructions (Addendum)
   Weekly net calorie calculator.   Applications for calorie counting.   LOW glycemic index. exercise goal of 150 minutes a week (plus warm up and cool down) of cardiovascular exercise.   adequate water consumption of at least 100 ounces a day  Exercise and healthy eating are a must to reach your goals.  You will achieve your goals if you commit to your health 100%. No shortcuts.    - Being and feeling overweight/obese is tough.    - Achieving your healthy body is tough.        - You get to choose your "tough."    You CAN do this.

## 2023-04-20 ENCOUNTER — Encounter: Payer: Self-pay | Admitting: Family Medicine

## 2023-04-23 ENCOUNTER — Telehealth: Payer: Self-pay

## 2023-04-23 NOTE — Telephone Encounter (Signed)
No further action needed.

## 2023-04-23 NOTE — Telephone Encounter (Signed)
Pharmacy Patient Advocate Encounter   Received notification from Patient Advice Request messages that prior authorization for Zepbound 2.5MG /0.5ML pen-injectors is required/requested.   Insurance verification completed.   The patient is insured through [facility] .   Per test claim: PA required; PA submitted to [facility] via CoverMyMeds Key/confirmation #/EOC [id number] Status is pending

## 2023-04-24 ENCOUNTER — Encounter: Payer: Self-pay | Admitting: Family Medicine

## 2023-04-24 MED ORDER — WEGOVY 0.25 MG/0.5ML ~~LOC~~ SOAJ: 0.2500 mg | SUBCUTANEOUS | 0 refills | Status: DC

## 2023-04-24 MED ORDER — CLONAZEPAM 0.5 MG PO TABS: 0.5000 mg | ORAL_TABLET | Freq: Two times a day (BID) | ORAL | 5 refills | Status: DC

## 2023-04-24 MED ORDER — WEGOVY 0.5 MG/0.5ML ~~LOC~~ SOAJ: 0.5000 mg | SUBCUTANEOUS | 0 refills | Status: DC

## 2023-04-24 NOTE — Telephone Encounter (Signed)
Please call patient - I have called and Wegovy in place of the Zepbound, since this seems to be her formulary.  -I have called in Klonopin again with twice daily dosing, # 60 in the bottle

## 2023-04-24 NOTE — Telephone Encounter (Signed)
Requesting: Clonazepam  Contract:n/a UDS:n/a Last Visit:04/19/23 Next Visit:05/31/23 Last Refill: 04/03/23 (60,5)  Please Advise, pt states she was advised to take a half pill in the mornings and a whole one at nights, so it only lasted 20 days

## 2023-04-24 NOTE — Telephone Encounter (Signed)
Responded and telephone note thread

## 2023-04-24 NOTE — Telephone Encounter (Signed)
Pharmacy Patient Advocate Encounter  Received notification from [facility] that Prior Authorization for Zepbound 2.5MG /0.5ML has been DENIED.  Full denial letter will be uploaded to the media tab. See denial reason below.   PA #/Case ID/Reference #: [id number]   DENIAL REASON: No trial and failure of  [facility]

## 2023-04-24 NOTE — Addendum Note (Signed)
Addended by: Felix Pacini A on: 04/24/2023 12:44 PM   Modules accepted: Orders

## 2023-04-24 NOTE — Telephone Encounter (Signed)
Spoke with patient regarding results/recommendations.  

## 2023-04-25 ENCOUNTER — Other Ambulatory Visit (HOSPITAL_COMMUNITY): Payer: Self-pay

## 2023-04-25 ENCOUNTER — Telehealth: Payer: Self-pay

## 2023-04-25 NOTE — Telephone Encounter (Signed)
Pharmacy Patient Advocate Encounter  Received notification from [facility] that Prior Authorization for Wegovy 0.25 MG/0.5ML Pen has been APPROVED from 04-25-2023 to 10-22-2023   PA #/Case ID/Reference #: [id number]

## 2023-04-25 NOTE — Telephone Encounter (Signed)
noted 

## 2023-04-25 NOTE — Telephone Encounter (Signed)
Pharmacy Patient Advocate Encounter   Received notification from Physician's Office that prior authorization for [facility] is required/requested.   Insurance verification completed.   The patient is insured through  [employer]  .   Per test claim: PA required; PA submitted to [employer] [facility] via CoverMyMeds Key/confirmation #/EOC (Key: [id number]) Status is pending

## 2023-04-26 NOTE — Telephone Encounter (Signed)
No further action needed.

## 2023-04-27 ENCOUNTER — Ambulatory Visit: Payer: Medicaid Other

## 2023-04-27 NOTE — Progress Notes (Signed)
Pt In for [facility] teaching today per Dr.Kuneff.  Demonstration given and also administered first dose.  Pt verbalized understanding and was advised to call us with any questions or concerns.   6 week follow up scheduled.

## 2023-05-04 DIAGNOSIS — M25562 Pain in left knee: Secondary | ICD-10-CM | POA: Diagnosis not present

## 2023-05-08 ENCOUNTER — Other Ambulatory Visit: Payer: Self-pay | Admitting: Family Medicine

## 2023-05-08 DIAGNOSIS — Z1231 Encounter for screening mammogram for malignant neoplasm of breast: Secondary | ICD-10-CM

## 2023-05-09 ENCOUNTER — Inpatient Hospital Stay
Admission: RE | Admit: 2023-05-09 | Discharge: 2023-05-09 | Disposition: A | Discharge status: Home / Self Care | Payer: Medicaid Other | Source: Ambulatory Visit | Attending: Family Medicine | Admitting: Family Medicine

## 2023-05-09 DIAGNOSIS — Z1231 Encounter for screening mammogram for malignant neoplasm of breast: Secondary | ICD-10-CM | POA: Diagnosis not present

## 2023-05-15 DIAGNOSIS — M25562 Pain in left knee: Secondary | ICD-10-CM | POA: Diagnosis not present

## 2023-05-18 ENCOUNTER — Encounter: Payer: Self-pay | Admitting: Family Medicine

## 2023-05-18 ENCOUNTER — Other Ambulatory Visit: Payer: Self-pay | Admitting: Family Medicine

## 2023-05-18 NOTE — Telephone Encounter (Signed)
No further action needed.

## 2023-05-18 NOTE — Telephone Encounter (Signed)
Pt has completed 0.25mg  and will be moving to 0.5mg  dose. Refill of 0.25mg  not appropriate at this time.

## 2023-05-20 ENCOUNTER — Encounter: Payer: Self-pay | Admitting: Family Medicine

## 2023-05-31 ENCOUNTER — Ambulatory Visit: Payer: Medicaid Other | Admitting: Family Medicine

## 2023-06-01 ENCOUNTER — Encounter: Payer: Self-pay | Admitting: Family Medicine

## 2023-06-01 ENCOUNTER — Ambulatory Visit: Payer: Medicaid Other | Admitting: Family Medicine

## 2023-06-01 VITALS — BP 108/80 | HR 64 | Temp 98.7°F | Ht 64.0 in | Wt 172.2 lb

## 2023-06-01 DIAGNOSIS — E785 Hyperlipidemia, unspecified: Secondary | ICD-10-CM | POA: Diagnosis not present

## 2023-06-01 DIAGNOSIS — E66811 Obesity, class 1: Secondary | ICD-10-CM | POA: Diagnosis not present

## 2023-06-01 DIAGNOSIS — E661 Drug-induced obesity: Secondary | ICD-10-CM | POA: Diagnosis not present

## 2023-06-01 DIAGNOSIS — I10 Essential (primary) hypertension: Secondary | ICD-10-CM

## 2023-06-01 DIAGNOSIS — Z6831 Body mass index (BMI) 31.0-31.9, adult: Secondary | ICD-10-CM

## 2023-06-01 MED ORDER — WEGOVY 1 MG/0.5ML ~~LOC~~ SOAJ: 1.0000 mg | SUBCUTANEOUS | 0 refills | Status: DC

## 2023-06-01 MED ORDER — WEGOVY 1.7 MG/0.75ML ~~LOC~~ SOAJ: 1.7000 mg | SUBCUTANEOUS | 0 refills | Status: DC

## 2023-06-01 NOTE — Progress Notes (Signed)
Kristine [name] , 06-03-1981, 42 y.o., female MRN: [id number] Patient Care Team    Relationship Specialty Notifications Start End  Natalia Leatherwood, DO PCP - General Family Medicine  04/28/15    Comment: Patient request female provider    Chief Complaint  Patient presents with   Obesity     Subjective: Kristine [name] is a 42 y.o. Pt presents for an OV to discuss nutrition, obesity and weight loss.  Patient diagnosed with morbid obesity-comorbid conditions of hypertension and hyperlipidemia.  Wt/lbs: 182.5 > 172LBS BMI: 31.34>29.56 Patient reports she is tolerating the [facility] starting tapering.  She does endorse mild nausea with start and increase dose for 1 day.  She has made the dietary changes recommended.  She has increased her water consumption in her diet. Diet: Patient brings her food diary in with her today.  This was reviewed with her in person. Calories: Currently does not count calories Exercise: She is working on increasing exercise. .      06/01/2023   10:38 AM 04/19/2023    9:11 AM 04/03/2023    8:12 AM 12/20/2022    8:28 AM 11/22/2022    7:43 AM  Depression screen PHQ 2/9  Decreased Interest 1 1 1  0 3  Down, Depressed, Hopeless 1 2 2  0 3  PHQ - 2 Score 2 3 3  0 6  Altered sleeping 0 1 2  2   Tired, decreased energy 1 1 2  2   Change in appetite 0 2 1  2   Feeling bad or failure about yourself  1 3 2  3   Trouble concentrating 0 2 2  2   Moving slowly or fidgety/restless 0 0 2  0  Suicidal thoughts 0 0 0  2  PHQ-9 Score 4 12 14  19   Difficult doing work/chores Somewhat difficult Somewhat difficult Somewhat difficult  Extremely dIfficult    Allergies  Allergen Reactions   Shellfish Allergy Anaphylaxis   Social History   Social History Narrative   Ms. Humble lives with her son in [place]. 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   Past Medical History:  Diagnosis Date   Anxiety    Chicken pox    Depression    Frequent headaches     Hyperhidrosis    Hyperlipidemia    Hypertension    Past Surgical History:  Procedure Laterality Date   CESAREAN SECTION  07/18/1999   CYST EXCISION     MASS EXCISION Right 09/19/2022   Procedure: EXCISION OF RIGHT SHOULDER MASS;  Surgeon: Emelia Loron, MD;  Location: [facility];  Service: General;  Laterality: Right;  LMA   Family History  Problem Relation Age of Onset   Hypertension Mother    Celiac disease Son    Breast cancer Neg Hx    Allergies as of 06/01/2023       Reactions   Shellfish Allergy Anaphylaxis        Medication List        Accurate as of June 01, 2023  3:23 PM. If you have any questions, ask your nurse or doctor.          STOP taking these medications    Wegovy 0.25 MG/0.5ML Soaj Generic drug: Semaglutide-Weight Management Replaced by: Wegovy 1 MG/0.5ML Soaj Stopped by: Felix Pacini   Wegovy 0.5 MG/0.5ML Soaj Generic drug: Semaglutide-Weight Management Replaced by: Wegovy 1.7 MG/0.75ML Soaj Stopped by: Felix Pacini       TAKE  these medications    amLODipine 10 MG tablet Commonly known as: NORVASC Take 1 tablet (10 mg total) by mouth daily.   atorvastatin 20 MG tablet Commonly known as: LIPITOR Take 1 tablet (20 mg total) by mouth daily.   cetirizine 10 MG tablet Commonly known as: ZYRTEC Take 10 mg by mouth daily.   clonazePAM 0.5 MG tablet Commonly known as: KLONOPIN Take 1 tablet (0.5 mg total) by mouth 2 (two) times daily.   escitalopram 20 MG tablet Commonly known as: LEXAPRO Take 1 tablet (20 mg total) by mouth daily.   ondansetron 4 MG disintegrating tablet Commonly known as: ZOFRAN-ODT Take 1 tablet (4 mg total) by mouth every 8 (eight) hours as needed for nausea or vomiting.   Wegovy 1 MG/0.5ML Soaj Generic drug: Semaglutide-Weight Management Inject 1 mg into the skin once a week. Start taking on: June 11, 2023 Replaces: WUJWJX 0.25 MG/0.5ML Soaj Started by: Felix Pacini   Wegovy  1.7 MG/0.75ML Soaj Generic drug: Semaglutide-Weight Management Inject 1.7 mg into the skin once a week. Start taking on: July 07, 2023 Replaces: BJYNWG 0.5 MG/0.5ML Soaj Started by: Felix Pacini        All past medical history, surgical history, allergies, family history, immunizations andmedications were updated in the EMR today and reviewed under the history and medication portions of their EMR.     ROS Negative, with the exception of above mentioned in HPI   Objective:  BP 108/80   Pulse 64   Temp 98.7 F (37.1 C)   Ht 5\' 4"  (1.626 m)   Wt 172 lb 3.2 oz (78.1 kg)   LMP 05/09/2023 (Approximate)   SpO2 97%   BMI 29.56 kg/m  Body mass index is 29.56 kg/m. Physical Exam Vitals and nursing note reviewed.  Constitutional:      General: She is not in acute distress.    Appearance: Normal appearance. She is normal weight. She is not ill-appearing or toxic-appearing.  HENT:     Head: Normocephalic and atraumatic.  Eyes:     General: No scleral icterus.       Right eye: No discharge.        Left eye: No discharge.     Extraocular Movements: Extraocular movements intact.     Conjunctiva/sclera: Conjunctivae normal.     Pupils: Pupils are equal, round, and reactive to light.  Skin:    Findings: No rash.  Neurological:     Mental Status: She is alert and oriented to person, place, and time. Mental status is at baseline.     Motor: No weakness.     Coordination: Coordination normal.     Gait: Gait normal.  Psychiatric:        Mood and Affect: Mood normal.        Behavior: Behavior normal.        Thought Content: Thought content normal.        Judgment: Judgment normal.     No results found. No results found. No results found for this or any previous visit (from the past 24 hour(s)).  Assessment/Plan: Kristine [name] is a 42 y.o. female present for OV for  Obesity/class 1 drug-induced obesity with serious comorbidity and body mass index (BMI) of 31.0 to 31.9  in adult-HTN/HLD Patient was counseled on exercise, calorie counting, weight loss and potential medications to help with weight loss today -Patient was provided with online resources for: Weekly net calorie calculator.  Applications for calorie counting.  Patient was advised  to ensure she is taking in adequate nutrition daily by meeting calorie goals. -Patient was educated on dietary changes to not only lose weight but to eat healthy.   -Patient was educated on low glycemic index foods and lean proteins. -Patient was educated on exercise goal of 150 minutes a week of cardiovascular exercise with goal to obtain optimal heart rate to reach cardiovascular/fat burning zones. -Patient was encouraged to maintain adequate water consumption of at least 80-100 ounces a day. Zofran prescribed in the event of any nausea Tolerating Wegovy taper. Finish Wegovy 0.5 mg weekly injections, then start the Wegovy 1 mg weekly injections for 4 weeks, then start Wegovy 1.7 mg injections for 4 weeks. She is doing excellent, and already down 10 pounds. Follow-up in 6-7 weeks weeks    Reviewed expectations re: course of current medical issues. Discussed self-management of symptoms. Outlined signs and symptoms indicating need for more acute intervention. Patient verbalized understanding and all questions were answered. Patient received an After-Visit Summary.    No orders of the defined types were placed in this encounter.  Meds ordered this encounter  Medications   WEGOVY 1 MG/0.5ML SOAJ    Sig: Inject 1 mg into the skin once a week.    Dispense:  2 mL    Refill:  0   WEGOVY 1.7 MG/0.75ML SOAJ    Sig: Inject 1.7 mg into the skin once a week.    Dispense:  3 mL    Refill:  0   Referral Orders  No referral(s) requested today     Note is dictated utilizing voice recognition software. Although note has been proof read prior to signing, occasional typographical errors still can be missed. If any questions  arise, please do not hesitate to call for verification.   electronically signed by:  Felix Pacini, DO  [facility] - [place]

## 2023-06-01 NOTE — Patient Instructions (Addendum)
Return in about 7 weeks (around 07/19/2023) for wt counseling. Randie Heinz to see you today.  I have refilled the medication(s) we provide.   If labs were collected or images ordered, we will inform you of  results once we have received them and reviewed. We will contact you either by echart message, or telephone call.  Please give ample time to the testing facility, and our office to run,  receive and review results. Please do not call inquiring of results, even if you can see them in your chart. We will contact you as soon as we are able. If it has been over 1 week since the test was completed, and you have not yet heard from [place], then please call us.    - echart message- for normal results that have been seen by the patient already.   - telephone call: abnormal results or if patient has not viewed results in their echart.  If a referral to a specialist was entered for you, please call us in 2 weeks if you have not heard from the specialist office to schedule.

## 2023-06-08 ENCOUNTER — Ambulatory Visit: Payer: Medicaid Other | Admitting: Family Medicine

## 2023-06-27 ENCOUNTER — Other Ambulatory Visit: Payer: Self-pay | Admitting: Family Medicine

## 2023-06-28 ENCOUNTER — Encounter: Payer: Self-pay | Admitting: Family Medicine

## 2023-07-13 ENCOUNTER — Encounter: Payer: Self-pay | Admitting: Family Medicine

## 2023-07-13 NOTE — Telephone Encounter (Signed)
No further action needed.

## 2023-07-20 ENCOUNTER — Encounter: Payer: Self-pay | Admitting: Family Medicine

## 2023-07-20 ENCOUNTER — Ambulatory Visit: Payer: Medicaid Other | Admitting: Family Medicine

## 2023-07-20 VITALS — BP 116/80 | HR 80 | Temp 97.4°F | Ht 63.0 in | Wt 165.2 lb

## 2023-07-20 DIAGNOSIS — Z6831 Body mass index (BMI) 31.0-31.9, adult: Secondary | ICD-10-CM

## 2023-07-20 DIAGNOSIS — E785 Hyperlipidemia, unspecified: Secondary | ICD-10-CM

## 2023-07-20 DIAGNOSIS — E66811 Obesity, class 1: Secondary | ICD-10-CM

## 2023-07-20 DIAGNOSIS — E661 Drug-induced obesity: Secondary | ICD-10-CM | POA: Diagnosis not present

## 2023-07-20 DIAGNOSIS — E669 Obesity, unspecified: Secondary | ICD-10-CM | POA: Diagnosis not present

## 2023-07-20 DIAGNOSIS — I10 Essential (primary) hypertension: Secondary | ICD-10-CM | POA: Diagnosis not present

## 2023-07-20 MED ORDER — WEGOVY 2.4 MG/0.75ML ~~LOC~~ SOAJ: 2.4000 mg | SUBCUTANEOUS | 5 refills | Status: DC

## 2023-07-20 NOTE — Progress Notes (Signed)
 Kristine [name] , 04/08/1981, 43 y.o., female MRN: [id number] Patient Care Team    Relationship Specialty Notifications Start End  Catherine Charlies LABOR, DO PCP - General Family Medicine  04/28/15    Comment: Patient request female provider    Chief Complaint  Patient presents with   Obesity     Subjective: Kristine [name] is a 43 y.o. Pt presents for an OV to discuss nutrition, obesity and weight loss.  Patient diagnosed with morbid obesity-comorbid conditions of hypertension and hyperlipidemia.  Wt/lbs: 182.5 > 172LBS> 165 BMI: 31.34>29.56> 29.26 Patient reports she is tolerating the Wegovy  1.7 so far, just started. She does endorse mild nausea with start and increase dose for 1 day.  She has made the dietary changes recommended.  She has increased her water consumption in her diet. Diet: Patient brings her food diary in with her today.  This was reviewed with her in person. Calories: Currently does not count calories Exercise: She is working on increasing exercise. .     06/01/2023   10:38 AM 04/19/2023    9:11 AM 04/03/2023    8:12 AM 12/20/2022    8:28 AM 11/22/2022    7:43 AM  Depression screen PHQ 2/9  Decreased Interest 1 1 1  0 3  Down, Depressed, Hopeless 1 2 2  0 3  PHQ - 2 Score 2 3 3  0 6  Altered sleeping 0 1 2  2   Tired, decreased energy 1 1 2  2   Change in appetite 0 2 1  2   Feeling bad or failure about yourself  1 3 2  3   Trouble concentrating 0 2 2  2   Moving slowly or fidgety/restless 0 0 2  0  Suicidal thoughts 0 0 0  2  PHQ-9 Score 4 12 14  19   Difficult doing work/chores Somewhat difficult Somewhat difficult Somewhat difficult  Extremely dIfficult    Allergies  Allergen Reactions   Shellfish Allergy Anaphylaxis   Social History   Social History Narrative   Kristine [name] lives with her son in [place]. 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   Past Medical History:  Diagnosis Date   Anxiety    Chicken pox    Depression    Frequent  headaches    Hyperhidrosis    Hyperlipidemia    Hypertension    Past Surgical History:  Procedure Laterality Date   CESAREAN SECTION  07/18/1999   CYST EXCISION     MASS EXCISION Right 09/19/2022   Procedure: EXCISION OF RIGHT SHOULDER MASS;  Surgeon: Ebbie Cough, MD;  Location: [facility];  Service: General;  Laterality: Right;  LMA   Family History  Problem Relation Age of Onset   Hypertension Mother    Celiac disease Son    Breast cancer Neg Hx    Allergies as of 07/20/2023       Reactions   Shellfish Allergy Anaphylaxis        Medication List        Accurate as of July 20, 2023  8:00 AM. If you have any questions, ask your nurse or doctor.          STOP taking these medications    Wegovy  1 MG/0.5ML Soaj Generic drug: Semaglutide -Weight Management Replaced by: Wegovy  2.4 MG/0.75ML Soaj Stopped by: Charlies Catherine   Wegovy  1.7 MG/0.75ML Soaj Generic drug: Semaglutide -Weight Management Stopped by: Charlies Catherine       TAKE these medications  amLODipine  10 MG tablet Commonly known as: NORVASC  Take 1 tablet (10 mg total) by mouth daily.   atorvastatin  20 MG tablet Commonly known as: LIPITOR Take 1 tablet (20 mg total) by mouth daily.   cetirizine 10 MG tablet Commonly known as: ZYRTEC Take 10 mg by mouth daily.   clonazePAM  0.5 MG tablet Commonly known as: KLONOPIN  Take 1 tablet (0.5 mg total) by mouth 2 (two) times daily.   escitalopram  20 MG tablet Commonly known as: LEXAPRO  Take 1 tablet (20 mg total) by mouth daily.   ondansetron  4 MG disintegrating tablet Commonly known as: ZOFRAN -ODT Take 1 tablet (4 mg total) by mouth every 8 (eight) hours as needed for nausea or vomiting.   Wegovy  2.4 MG/0.75ML Soaj Generic drug: Semaglutide -Weight Management Inject 2.4 mg into the skin once a week. Start taking on: August 10, 2023 Replaces: Wegovy  1 MG/0.5ML Soaj Started by: Charlies Bellini        All past medical history,  surgical history, allergies, family history, immunizations andmedications were updated in the EMR today and reviewed under the history and medication portions of their EMR.     ROS Negative, with the exception of above mentioned in HPI   Objective:  BP 116/80   Pulse 80   Temp (!) 97.4 F (36.3 C)   Ht 5' 3 (1.6 m)   Wt 165 lb 3.2 oz (74.9 kg)   SpO2 97%   BMI 29.26 kg/m  Body mass index is 29.26 kg/m. Physical Exam Vitals and nursing note reviewed.  Constitutional:      General: She is not in acute distress.    Appearance: Normal appearance. She is normal weight. She is not ill-appearing or toxic-appearing.  HENT:     Head: Normocephalic and atraumatic.  Eyes:     General: No scleral icterus.       Right eye: No discharge.        Left eye: No discharge.     Extraocular Movements: Extraocular movements intact.     Conjunctiva/sclera: Conjunctivae normal.     Pupils: Pupils are equal, round, and reactive to light.  Skin:    Findings: No rash.  Neurological:     Mental Status: She is alert and oriented to person, place, and time. Mental status is at baseline.     Motor: No weakness.     Coordination: Coordination normal.     Gait: Gait normal.  Psychiatric:        Mood and Affect: Mood normal.        Behavior: Behavior normal.        Thought Content: Thought content normal.        Judgment: Judgment normal.     No results found. No results found. No results found for this or any previous visit (from the past 24 hours).  Assessment/Plan: Kristine [name] is a 43 y.o. 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  Applications for calorie counting.  Patient was  advised to ensure she is taking in adequate nutrition daily by meeting calorie goals. -Patient was educated on dietary changes to not only lose weight but to eat healthy.   -Patient was educated on low glycemic index foods and lean proteins. -Patient was  educated on exercise goal of 150 minutes a week of cardiovascular exercise with goal to obtain optimal heart rate to reach cardiovascular/fat burning zones. -Patient was encouraged to maintain adequate water consumption of at least 80-100 ounces a day. Zofran  prescribed in the event of any nausea Tolerating Wegovy  1.7 Continue  Wegovy  1.7 mg injections for 4 weeks.> then increase to 2.4 mg qd Follow-up in 7-8   Reviewed expectations re: course of current medical issues. Discussed self-management of symptoms. Outlined signs and symptoms indicating need for more acute intervention. Patient verbalized understanding and all questions were answered. Patient received an After-Visit Summary.    No orders of the defined types were placed in this encounter.  Meds ordered this encounter  Medications   WEGOVY  2.4 MG/0.75ML SOAJ    Sig: Inject 2.4 mg into the skin once a week.    Dispense:  3 mL    Refill:  5   Referral Orders  No referral(s) requested today     Note is dictated utilizing voice recognition software. Although note has been proof read prior to signing, occasional typographical errors still can be missed. If any questions arise, please do not hesitate to call for verification.   electronically signed by:  Charlies Bellini, DO  [facility] - [place]

## 2023-07-20 NOTE — Patient Instructions (Signed)
 Return in about 7 weeks (around 09/07/2023) for Routine chronic condition follow-up.        Great to see you today.  I have refilled the medication(s) we provide.   If labs were collected or images ordered, we will inform you of  results once we have received them and reviewed. We will contact you either by echart message, or telephone call.  Please give ample time to the testing facility, and our office to run,  receive and review results. Please do not call inquiring of results, even if you can see them in your chart. We will contact you as soon as we are able. If it has been over 1 week since the test was completed, and you have not yet heard from us , then please call us .    - echart message- for normal results that have been seen by the patient already.   - telephone call: abnormal results or if patient has not viewed results in their echart.  If a referral to a specialist was entered for you, please call us  in 2 weeks if you have not heard from the specialist office to schedule.

## 2023-07-26 DIAGNOSIS — M25562 Pain in left knee: Secondary | ICD-10-CM | POA: Diagnosis not present

## 2023-08-09 ENCOUNTER — Other Ambulatory Visit: Payer: Self-pay | Admitting: Family Medicine

## 2023-08-09 ENCOUNTER — Encounter: Payer: Self-pay | Admitting: Family Medicine

## 2023-08-10 ENCOUNTER — Other Ambulatory Visit: Payer: Self-pay

## 2023-08-10 MED ORDER — ONDANSETRON 4 MG PO TBDP: 4.0000 mg | ORAL_TABLET | Freq: Three times a day (TID) | ORAL | 0 refills | Status: AC | PRN

## 2023-08-28 DIAGNOSIS — M25562 Pain in left knee: Secondary | ICD-10-CM | POA: Diagnosis not present

## 2023-09-07 ENCOUNTER — Encounter: Payer: Self-pay | Admitting: Family Medicine

## 2023-09-07 ENCOUNTER — Ambulatory Visit: Payer: Medicaid Other | Admitting: Family Medicine

## 2023-09-07 VITALS — BP 112/78 | HR 80 | Temp 98.2°F | Wt 155.8 lb

## 2023-09-07 DIAGNOSIS — E66811 Obesity, class 1: Secondary | ICD-10-CM

## 2023-09-07 DIAGNOSIS — Z6831 Body mass index (BMI) 31.0-31.9, adult: Secondary | ICD-10-CM | POA: Diagnosis not present

## 2023-09-07 DIAGNOSIS — F418 Other specified anxiety disorders: Secondary | ICD-10-CM | POA: Diagnosis not present

## 2023-09-07 DIAGNOSIS — Z79899 Other long term (current) drug therapy: Secondary | ICD-10-CM

## 2023-09-07 DIAGNOSIS — E669 Obesity, unspecified: Secondary | ICD-10-CM

## 2023-09-07 DIAGNOSIS — I10 Essential (primary) hypertension: Secondary | ICD-10-CM

## 2023-09-07 DIAGNOSIS — E661 Drug-induced obesity: Secondary | ICD-10-CM

## 2023-09-07 DIAGNOSIS — E559 Vitamin D deficiency, unspecified: Secondary | ICD-10-CM

## 2023-09-07 DIAGNOSIS — G479 Sleep disorder, unspecified: Secondary | ICD-10-CM

## 2023-09-07 DIAGNOSIS — E785 Hyperlipidemia, unspecified: Secondary | ICD-10-CM

## 2023-09-07 DIAGNOSIS — F410 Panic disorder [episodic paroxysmal anxiety] without agoraphobia: Secondary | ICD-10-CM | POA: Diagnosis not present

## 2023-09-07 LAB — CBC
HCT: 38.3 % (ref 36.0–46.0)
Hemoglobin: 12.6 g/dL (ref 12.0–15.0)
MCHC: 33.1 g/dL (ref 30.0–36.0)
MCV: 82.2 fL (ref 78.0–100.0)
Platelets: 500 10*3/uL — ABNORMAL HIGH (ref 150.0–400.0)
RBC: 4.65 Mil/uL (ref 3.87–5.11)
RDW: 14 % (ref 11.5–15.5)
WBC: 6 10*3/uL (ref 4.0–10.5)

## 2023-09-07 LAB — COMPREHENSIVE METABOLIC PANEL
ALT: 24 U/L (ref 0–35)
AST: 16 U/L (ref 0–37)
Albumin: 4.2 g/dL (ref 3.5–5.2)
Alkaline Phosphatase: 77 U/L (ref 39–117)
BUN: 8 mg/dL (ref 6–23)
CO2: 26 meq/L (ref 19–32)
Calcium: 9.4 mg/dL (ref 8.4–10.5)
Chloride: 105 meq/L (ref 96–112)
Creatinine, Ser: 0.88 mg/dL (ref 0.40–1.20)
GFR: 81.31 mL/min (ref >60.00)
Glucose, Bld: 76 mg/dL (ref 70–99)
Potassium: 4.1 meq/L (ref 3.5–5.1)
Sodium: 139 meq/L (ref 135–145)
Total Bilirubin: 0.4 mg/dL (ref 0.2–1.2)
Total Protein: 7.5 g/dL (ref 6.0–8.3)

## 2023-09-07 LAB — TSH: TSH: 0.7 u[IU]/mL (ref 0.35–5.50)

## 2023-09-07 MED ORDER — ESCITALOPRAM OXALATE 20 MG PO TABS: 20.0000 mg | ORAL_TABLET | Freq: Every day | ORAL | 1 refills | Status: DC

## 2023-09-07 MED ORDER — CLONAZEPAM 0.5 MG PO TABS: 0.5000 mg | ORAL_TABLET | Freq: Two times a day (BID) | ORAL | 5 refills | Status: DC

## 2023-09-07 MED ORDER — AMLODIPINE BESYLATE 10 MG PO TABS: 10.0000 mg | ORAL_TABLET | Freq: Every day | ORAL | 1 refills | Status: DC

## 2023-09-07 NOTE — Patient Instructions (Addendum)
Return in about 8 weeks (around 11/02/2023) for weight loss counseling  follow up.  And 5.5 mos for [facility]   You are doing great!!!!       Great to see you today.  I have refilled the medication(s) we provide.   If labs were collected or images ordered, we will inform you of  results once we have received them and reviewed. We will contact you either by echart message, or telephone call.  Please give ample time to the testing facility, and our office to run,  receive and review results. Please do not call inquiring of results, even if you can see them in your chart. We will contact you as soon as we are able. If it has been over 1 week since the test was completed, and you have not yet heard from [place], then please call us.    - echart message- for normal results that have been seen by the patient already.   - telephone call: abnormal results or if patient has not viewed results in their echart.  If a referral to a specialist was entered for you, please call us in 2 weeks if you have not heard from the specialist office to schedule.

## 2023-09-07 NOTE — Progress Notes (Signed)
Kristine [name] , 03-Oct-1980, 43 y.o., female MRN: [id number] Patient Care Team    Relationship Specialty Notifications Start End  Natalia Leatherwood, DO PCP - General Family Medicine  04/28/15    Comment: Patient request female provider    Chief Complaint  Patient presents with   Medical Management of Chronic Issues    Pt is fasting.    Hypertension     Subjective: Kristine [name] is a 43 y.o. Pt presents for an OV to for Chronic Conditions/illness Management And to discuss nutrition, obesity and weight loss.  Patient diagnosed with morbid obesity-comorbid conditions of hypertension and hyperlipidemia.  Wt/lbs: 182.5 > 172> 165> 155 BMI: 31.34>29.56> 29.26> 27.6 Patient reports she is tolerating the Wegovy 2.4 mg weekly.She does endorse mild nausea with start and increase dose for 1 day.  She has made the dietary changes recommended.  She has increased her water consumption in her diet. Diet: reviewed food diary initial encounter and made suggestion.  Calories: Currently does not count calories Exercise: She is working on increasing exercise. . Depression with anxiety/sleep disturbance:  Reports complaince with lexapro 20 mg qd and Klonopin 0.5 mg twice daily.  Patient reports she feels her regimen is working well for her. Prior note: Patient is having extreme amount of stress.  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 She has been needing to lean on the Xanax more frequently.  She is uncertain if the Wellbutrin made much of a difference since she is going through more of a stressful situation currently it is hard for her to gauge. Prior note: In the past she has been prescribed buspar and paxil. Buspar was not helpful- but she did well with paxil. She had not been prescribed meds in sometime (2017).  She had been attending therapy sessions and her therapist  reccommended she see a psychiatrist. The psychiatrist started her on pristiq 100, wellbutrin 150 added later, then seroquel and xanax. She reports the majority of her symptoms are related to anxiety, but she endorses depression as well. She has panic attacks at times and uses the xanax prescribed by psych at those times (rarely needs. She endorses headache and shakiness yesterday- she assumed it was from abruptly stopping pristiq.   Hypertension/overweight/HLD:  Pt reports compliance  with amlodipine 10 mg.  Patient denies chest pain, shortness of breath, dizziness or lower extremity edema.  Diet: not routinely.  RF: HTN, HLD, obesity ROS: See pertinent positives and negatives per HPI.    09/07/2023    7:43 AM 06/01/2023   10:38 AM 04/19/2023    9:11 AM 04/03/2023    8:12 AM 12/20/2022    8:28 AM  Depression screen PHQ 2/9  Decreased Interest 1 1 1 1  0  Down, Depressed, Hopeless 1 1 2 2  0  PHQ - 2 Score 2 2 3 3  0  Altered sleeping 2 0 1 2   Tired, decreased energy 1 1 1 2    Change in appetite 0 0 2 1   Feeling bad or failure about yourself  1 1 3 2    Trouble concentrating 0 0 2 2   Moving slowly or fidgety/restless 0 0 0 2   Suicidal thoughts 0 0 0 0   PHQ-9 Score 6 4 12 14    Difficult doing work/chores Somewhat difficult Somewhat difficult Somewhat difficult Somewhat  difficult     Allergies  Allergen Reactions   Shellfish Allergy Anaphylaxis   Social History   Social History Narrative   Kristine [name] lives with her son in [place]. 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   Past Medical History:  Diagnosis Date   Anxiety    Chicken pox    Depression    Frequent headaches    Hyperhidrosis    Hyperlipidemia    Hypertension    Past Surgical History:  Procedure Laterality Date   CESAREAN SECTION  07/18/1999   CYST EXCISION     MASS EXCISION Right 09/19/2022   Procedure: EXCISION OF RIGHT SHOULDER MASS;  Surgeon: Emelia Loron, MD;  Location: [facility];  Service: General;  Laterality: Right;  LMA   Family History  Problem Relation Age of Onset   Hypertension Mother    Celiac disease Son    Breast cancer Neg Hx    Allergies as of 09/07/2023       Reactions   Shellfish Allergy Anaphylaxis        Medication List        Accurate as of September 07, 2023  7:50 AM. If you have any questions, ask your nurse or doctor.          amLODipine 10 MG tablet Commonly known as: NORVASC Take 1 tablet (10 mg total) by mouth daily.   atorvastatin 20 MG tablet Commonly known as: LIPITOR Take 1 tablet (20 mg total) by mouth daily.   cetirizine 10 MG tablet Commonly known as: ZYRTEC Take 10 mg by mouth daily.   clonazePAM 0.5 MG tablet Commonly known as: KLONOPIN Take 1 tablet (0.5 mg total) by mouth 2 (two) times daily.   escitalopram 20 MG tablet Commonly known as: LEXAPRO Take 1 tablet (20 mg total) by mouth daily.   ondansetron 4 MG disintegrating tablet Commonly known as: ZOFRAN-ODT Take 1 tablet (4 mg total) by mouth every 8 (eight) hours as needed for nausea or vomiting.   Wegovy 2.4 MG/0.75ML Soaj Generic drug: Semaglutide-Weight Management Inject 2.4 mg into the skin once a week.        All past medical history, surgical history, allergies, family history, immunizations andmedications were updated in the EMR today and reviewed under the history and medication portions of their EMR.     ROS Negative, with the exception of above mentioned in HPI   Objective:  BP 112/78   Pulse 80   Temp 98.2 F (36.8 C)   Wt 155 lb 12.8 oz (70.7 kg)   SpO2 99%   BMI 27.60 kg/m  Body mass index is 27.6 kg/m. Physical Exam Vitals and nursing note reviewed.  Constitutional:      General: She is not in acute distress.    Appearance: Normal appearance. She is normal weight. She is not ill-appearing, toxic-appearing or diaphoretic.  HENT:     Head: Normocephalic and atraumatic.  Eyes:     General: No scleral  icterus.       Right eye: No discharge.        Left eye: No discharge.     Extraocular Movements: Extraocular movements intact.     Conjunctiva/sclera: Conjunctivae normal.     Pupils: Pupils are equal, round, and reactive to light.  Cardiovascular:     Rate and Rhythm: Normal rate and regular rhythm.  Pulmonary:     Effort: Pulmonary effort is normal. No respiratory distress.     Breath sounds:  Normal breath sounds. No wheezing, rhonchi or rales.  Musculoskeletal:     Right lower leg: No edema.     Left lower leg: No edema.  Skin:    General: Skin is warm.     Findings: No rash.  Neurological:     Mental Status: She is alert and oriented to person, place, and time. Mental status is at baseline.     Motor: No weakness.     Coordination: Coordination normal.     Gait: Gait normal.  Psychiatric:        Mood and Affect: Mood normal.        Behavior: Behavior normal.        Thought Content: Thought content normal.        Judgment: Judgment normal.     No results found. No results found. No results found for this or any previous visit (from the past 24 hours).  Assessment/Plan: Kristine [name] is a 43 y.o. 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  Applications for calorie counting.  Patient was advised to ensure she is taking in adequate nutrition daily by meeting calorie goals. -Patient was educated on dietary changes to not only lose weight but to eat healthy.   -Patient was educated on low glycemic index foods and lean proteins. -Patient was educated on exercise goal of 150 minutes a week of cardiovascular exercise with goal to obtain optimal heart rate to reach  cardiovascular/fat burning zones. -Patient was encouraged to maintain adequate water consumption of at least 80-100 ounces a day. Continue Zofran prescribed in the event of any nausea Continue  Wegovy 2.4 mg qd Follow-up in 8 weeks on weight loss counseling  Anxiety with depression/Panic attacks/sleep disturbance stable Continue Lexapro 20 mg daily Continue Klonopin 0.5 mg twice daily (usually takes 1/2 tab in the day) Tried meds: BuSpar- did not work well.  Paxil-weight gain.  Pristiq-did not seem to work well. Wellbutrin-did not work well.  Xanax-rarely needs.  Seroquel-made too sleepy even on 12.5 mg, Valium worked okay, but he had higher doses which caused sedation   Essential hypertension/Hyperlipidemia LDL goal <130/on statin/obesity Stable Continue amlodipine 10 mg QD.  Continue Lipitor.   CBC, CMP and TSH collected today - continue exercise and low salt diet - f/u 5.5 mos on chronic conditions  Reviewed expectations re: course of current medical issues. Discussed self-management of symptoms. Outlined signs and symptoms indicating need for more acute intervention. Patient verbalized understanding and all questions were answered. Patient received an After-Visit Summary.    Orders Placed This Encounter  Procedures   CBC   Comp Met (CMET)   TSH   Drug Monitoring Panel [phone] , Urine   Meds ordered this encounter  Medications   amLODipine (NORVASC) 10 MG tablet    Sig: Take 1 tablet (10 mg total) by mouth daily.    Dispense:  90 tablet    Refill:  1   escitalopram (LEXAPRO) 20 MG tablet    Sig: Take 1 tablet (20 mg total) by mouth daily.    Dispense:  90 tablet    Refill:  1   Referral Orders  No referral(s) requested today     Note is dictated utilizing voice recognition software.  Although note has been proof read prior to signing, occasional typographical errors still can be missed. If any questions arise, please do not hesitate to call for verification.    electronically signed by:  Felix Pacini, DO  [facility] [place]

## 2023-09-09 LAB — DRUG MONITORING PANEL 376104, URINE
Amphetamines: NEGATIVE ng/mL (ref <500)
Barbiturates: NEGATIVE ng/mL (ref <300)
Benzodiazepines: NEGATIVE ng/mL (ref <100)
Cocaine Metabolite: NEGATIVE ng/mL (ref <150)
Desmethyltramadol: NEGATIVE ng/mL (ref <100)
Opiates: NEGATIVE ng/mL (ref <100)
Oxycodone: NEGATIVE ng/mL (ref <100)
Tramadol: NEGATIVE ng/mL (ref <100)

## 2023-09-09 LAB — DM TEMPLATE

## 2023-09-10 ENCOUNTER — Encounter: Payer: Self-pay | Admitting: Family Medicine

## 2023-09-10 ENCOUNTER — Ambulatory Visit: Payer: Self-pay | Admitting: Family Medicine

## 2023-09-10 NOTE — Telephone Encounter (Signed)
 Copied from CRM [id number]. Topic: Clinical - Lab/Test Results >> Sep 10, 2023  9:20 AM Lennart Pall wrote: Reason for CRM: patient wanting a nurse to go over lab results from Friday  The patient stated that she is concerned about her labs that were drawn Friday as her platelets are elevated. She said they were elevated in the past but now they are even higher.  She does not recall her pcp discussing her platelets in the past and she is concerned about what could be going on.  Pcp has not yet reviewed labs.  Routed to pcp for awareness.   Reason for Disposition  Caller requesting lab results  (Exception: Routine or non-urgent lab result.)  Answer Assessment - Initial Assessment Questions 1. REASON FOR CALL or QUESTION: "What is your reason for calling today?" or "How can I best help you?" or "What question do you have that I can help answer?"     Why are platelets elevated 2. CALLER: Document the source of call. (e.g., laboratory, patient).     patient  Protocols used: PCP Call - No Triage-A-AH

## 2023-09-10 NOTE — Telephone Encounter (Signed)
 Message previously sent to provider.

## 2023-09-10 NOTE — Telephone Encounter (Signed)
 Pt sent mychart message

## 2023-09-18 ENCOUNTER — Ambulatory Visit: Payer: Medicaid Other | Admitting: Family Medicine

## 2023-10-02 DIAGNOSIS — W19XXXA Unspecified fall, initial encounter: Secondary | ICD-10-CM | POA: Diagnosis not present

## 2023-10-02 DIAGNOSIS — S63602A Unspecified sprain of left thumb, initial encounter: Secondary | ICD-10-CM | POA: Diagnosis not present

## 2023-10-02 DIAGNOSIS — M25532 Pain in left wrist: Secondary | ICD-10-CM | POA: Diagnosis not present

## 2023-10-02 DIAGNOSIS — M79642 Pain in left hand: Secondary | ICD-10-CM | POA: Diagnosis not present

## 2023-10-25 ENCOUNTER — Other Ambulatory Visit (HOSPITAL_COMMUNITY): Payer: Self-pay

## 2023-10-26 ENCOUNTER — Telehealth: Payer: Self-pay | Admitting: Pharmacy Technician

## 2023-10-26 ENCOUNTER — Other Ambulatory Visit (HOSPITAL_COMMUNITY): Payer: Self-pay

## 2023-10-26 NOTE — Telephone Encounter (Signed)
 Pharmacy Patient Advocate Encounter   Received notification from CoverMyMeds that prior authorization for Wegovy 2.4MG /0.75ML auto-injectors is required/requested.   Insurance verification completed.   The patient is insured through [facility] .   Per test claim: PA required; PA started via CoverMyMeds. KEY [id number] . Please see clinical question(s) below that I am not finding the answer to in her chart and advise.  Patient will need to be scheduled for a weight check. Insurance requires a weight that was taken within the last 45 days to be submitted.

## 2023-10-29 NOTE — Telephone Encounter (Signed)
 No further action needed.

## 2023-10-30 ENCOUNTER — Other Ambulatory Visit (HOSPITAL_COMMUNITY): Payer: Self-pay

## 2023-10-31 ENCOUNTER — Other Ambulatory Visit (HOSPITAL_COMMUNITY): Payer: Self-pay

## 2023-10-31 ENCOUNTER — Encounter: Payer: Self-pay | Admitting: Family Medicine

## 2023-10-31 ENCOUNTER — Ambulatory Visit: Payer: Medicaid Other | Admitting: Family Medicine

## 2023-10-31 VITALS — BP 114/74 | HR 78 | Temp 98.2°F | Wt 146.4 lb

## 2023-10-31 DIAGNOSIS — E661 Drug-induced obesity: Secondary | ICD-10-CM | POA: Diagnosis not present

## 2023-10-31 DIAGNOSIS — Z6831 Body mass index (BMI) 31.0-31.9, adult: Secondary | ICD-10-CM

## 2023-10-31 DIAGNOSIS — E66811 Obesity, class 1: Secondary | ICD-10-CM

## 2023-10-31 DIAGNOSIS — I10 Essential (primary) hypertension: Secondary | ICD-10-CM | POA: Diagnosis not present

## 2023-10-31 MED ORDER — WEGOVY 2.4 MG/0.75ML ~~LOC~~ SOAJ: 2.4000 mg | SUBCUTANEOUS | 5 refills | Status: DC

## 2023-10-31 NOTE — Progress Notes (Signed)
 Kristine [name] , 1980/08/17, 43 y.o., female MRN: [id number] Patient Care Team    Relationship Specialty Notifications Start End  Natalia Leatherwood, DO PCP - General Family Medicine  04/28/15    Comment: Patient request female provider    Chief Complaint  Patient presents with   Weight Management Screening     Subjective: Kristine [name] is a 43 y.o. Pt presents for an OV  to discuss nutrition, obesity and weight loss.  Patient diagnosed with morbid obesity-comorbid conditions of hypertension and hyperlipidemia.  Wt/lbs: 182.5 > 172> 165> 155> 146 BMI: 31.34>29.56> 29.26> 27.6> 25.9 Patient reports she is tolerating the Wegovy 2.4 mg weekly.S mild nausea reported on occasion.  Diet: reviewed food diary initial encounter and made suggestion.   She has made the dietary changes recommended.   She has increased her water consumption in her diet.   Calories: Currently does not count calories, but doing well.  Exercise: She is working on increasing exercise. She has a puppy now, they are doing fast walking and she is using treadmill.   ROS: See pertinent positives and negatives per HPI.    10/31/2023    7:46 AM 09/07/2023    7:43 AM 06/01/2023   10:38 AM 04/19/2023    9:11 AM 04/03/2023    8:12 AM  Depression screen PHQ 2/9  Decreased Interest 1 1 1 1 1   Down, Depressed, Hopeless 1 1 1 2 2   PHQ - 2 Score 2 2 2 3 3   Altered sleeping 3 2 0 1 2  Tired, decreased energy 2 1 1 1 2   Change in appetite 0 0 0 2 1  Feeling bad or failure about yourself  1 1 1 3 2   Trouble concentrating 2 0 0 2 2  Moving slowly or fidgety/restless 0 0 0 0 2  Suicidal thoughts 0 0 0 0 0  PHQ-9 Score 10 6 4 12 14   Difficult doing work/chores Somewhat difficult Somewhat difficult Somewhat difficult Somewhat difficult Somewhat difficult    Allergies  Allergen Reactions   Shellfish Allergy Anaphylaxis   Social History   Social History Narrative   [name] lives with her son in [place]. 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   Past Medical History:  Diagnosis Date   Anxiety    Chicken pox    Depression    Frequent headaches    Hyperhidrosis    Hyperlipidemia    Hypertension    Past Surgical History:  Procedure Laterality Date   CESAREAN SECTION  07/18/1999   CYST EXCISION     MASS EXCISION Right 09/19/2022   Procedure: EXCISION OF RIGHT SHOULDER MASS;  Surgeon: Emelia Loron, MD;  Location: [facility];  Service: General;  Laterality: Right;  LMA   Family History  Problem Relation Age of Onset   Hypertension Mother    Celiac disease Son    Breast cancer Neg Hx    Allergies as of 10/31/2023       Reactions   Shellfish Allergy Anaphylaxis        Medication List        Accurate as of October 31, 2023  7:58 AM. If you have any questions, ask your nurse or doctor.          STOP taking these medications    cetirizine 10 MG tablet Commonly known as: ZYRTEC Stopped by: Felix Pacini       TAKE these medications  amLODipine 10 MG tablet Commonly known as: NORVASC Take 1 tablet (10 mg total) by mouth daily.   atorvastatin 20 MG tablet Commonly known as: LIPITOR Take 1 tablet (20 mg total) by mouth daily.   clonazePAM 0.5 MG tablet Commonly known as: KLONOPIN Take 1 tablet (0.5 mg total) by mouth 2 (two) times daily.   escitalopram 20 MG tablet Commonly known as: LEXAPRO Take 1 tablet (20 mg total) by mouth daily.   ondansetron 4 MG disintegrating tablet Commonly known as: ZOFRAN-ODT Take 1 tablet (4 mg total) by mouth every 8 (eight) hours as needed for nausea or vomiting.   Wegovy 2.4 MG/0.75ML Soaj Generic drug: Semaglutide-Weight Management Inject 2.4 mg into the skin once a week.        All past medical history, surgical history, allergies, family history, immunizations andmedications were updated in the EMR today and reviewed under the history and medication portions of their EMR.     ROS Negative,  with the exception of above mentioned in HPI   Objective:  BP 114/74   Pulse 78   Temp 98.2 F (36.8 C)   Wt 146 lb 6.4 oz (66.4 kg)   SpO2 97%   BMI 25.93 kg/m  Body mass index is 25.93 kg/m. Physical Exam Vitals and nursing note reviewed.  Constitutional:      General: She is not in acute distress.    Appearance: Normal appearance. She is normal weight. She is not ill-appearing, toxic-appearing or diaphoretic.  HENT:     Head: Normocephalic and atraumatic.  Eyes:     General: No scleral icterus.       Right eye: No discharge.        Left eye: No discharge.     Extraocular Movements: Extraocular movements intact.     Conjunctiva/sclera: Conjunctivae normal.     Pupils: Pupils are equal, round, and reactive to light.  Cardiovascular:     Rate and Rhythm: Normal rate and regular rhythm.  Pulmonary:     Effort: Pulmonary effort is normal. No respiratory distress.     Breath sounds: Normal breath sounds. No wheezing, rhonchi or rales.  Musculoskeletal:     Right lower leg: No edema.     Left lower leg: No edema.  Skin:    General: Skin is warm.     Findings: No rash.  Neurological:     Mental Status: She is alert and oriented to person, place, and time. Mental status is at baseline.     Motor: No weakness.     Coordination: Coordination normal.     Gait: Gait normal.  Psychiatric:        Mood and Affect: Mood normal.        Behavior: Behavior normal.        Thought Content: Thought content normal.        Judgment: Judgment normal.     No results found. No results found. No results found for this or any previous visit (from the past 24 hours).  Assessment/Plan: Kristine [name] is a 43 y.o. 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  Applications for calorie counting.  Patient was advised to ensure she is taking in adequate nutrition daily by meeting  calorie goals. -Patient was educated on dietary changes to not only lose weight but to eat healthy.   -Patient was educated on low glycemic index foods and lean proteins. -Patient was educated on exercise goal of 150 minutes a week of cardiovascular exercise with goal to obtain optimal heart rate to reach cardiovascular/fat burning zones. -Patient was encouraged to maintain adequate water consumption of at least 80-100 ounces a day. Continue Zofran prescribed in the event of any nausea Continue  Wegovy 2.4 mg qd Follow-up in 6-8 weeks on weight loss counseling   Reviewed expectations re: course of current medical issues. Discussed self-management of symptoms. Outlined signs and symptoms indicating need for more acute intervention. Patient verbalized understanding and all questions were answered. Patient received an After-Visit Summary.    No orders of the defined types were placed in this encounter.  Meds ordered this encounter  Medications   WEGOVY 2.4 MG/0.75ML SOAJ    Sig: Inject 2.4 mg into the skin once a week.    Dispense:  3 mL    Refill:  5   Referral Orders  No referral(s) requested today     Note is dictated utilizing voice recognition software. Although note has been proof read prior to signing, occasional typographical errors still can be missed. If any questions arise, please do not hesitate to call for verification.   electronically signed by:  Napolean Backbone, DO  [facility] [place]

## 2023-10-31 NOTE — Patient Instructions (Addendum)
 I suspect you have mild iron deficiency again by last labs. You should incorporate more iron into your diet. Good plant sources of iron are lentils, chickpeas, beans, tofu, cashew nuts, pumpkin seeds, kale, dried apricots and figs, raisins, quinoa and fortified breakfast cereal.  - also taking a multivitamin and a "gentle iron" supplement can help.  Make sure to take a women's multivitamin, an extra vit d 1000 units and a B-complex vitamin is also helpful with energy/nerve health.     Weekly net calorie calculator.    exercise goal of 150 minutes a week (plus warm up and cool down) of cardiovascular exercise.   adequate water consumption of at least 80-100 ounces a day  Exercise and healthy eating are a must to reach your goals and maintain them long term. You will achieve your goals if you commit to your health 100%. No shortcuts.      You CAN do this!    Return in about 8 weeks (around 12/26/2023).        Great to see you today.  I have refilled the medication(s) we provide.   If labs were collected or images ordered, we will inform you of  results once we have received them and reviewed. We will contact you either by echart message, or telephone call.  Please give ample time to the testing facility, and our office to run,  receive and review results. Please do not call inquiring of results, even if you can see them in your chart. We will contact you as soon as we are able. If it has been over 1 week since the test was completed, and you have not yet heard from us , then please call us .    - echart message- for normal results that have been seen by the patient already.   - telephone call: abnormal results or if patient has not viewed results in their echart.  If a referral to a specialist was entered for you, please call us  in 2 weeks if you have not heard from the specialist office to schedule.

## 2023-10-31 NOTE — Telephone Encounter (Signed)
 Pharmacy Patient Advocate Encounter  Received notification from [facility] that Prior Authorization for [facility] 2.4MG /0.75ML auto-injectors has been APPROVED from 10/31/23 to 10/30/24. Ran test claim, Copay is $4. This test claim was processed through [facility] Pharmacy- copay amounts may vary at other pharmacies due to pharmacy/plan contracts, or as the patient moves through the different stages of their insurance plan.   PA #/Case ID/Reference #: [id number]

## 2023-10-31 NOTE — Telephone Encounter (Signed)
 Pharmacy Patient Advocate Encounter   Received notification from CoverMyMeds that prior authorization for [facility] 2.4MG /0.75ML auto-injectors is required/requested.   Insurance verification completed.   The patient is insured through [facility] .   Per test claim: PA required; PA submitted to above mentioned insurance via CoverMyMeds Key/confirmation #/EOC [id number] Status is pending

## 2023-12-11 ENCOUNTER — Ambulatory Visit: Admitting: Family Medicine

## 2023-12-11 ENCOUNTER — Other Ambulatory Visit (HOSPITAL_COMMUNITY)
Admission: RE | Admit: 2023-12-11 | Discharge: 2023-12-11 | Disposition: A | Discharge status: Home / Self Care | Source: Ambulatory Visit | Attending: Family Medicine | Admitting: Family Medicine

## 2023-12-11 ENCOUNTER — Encounter: Payer: Self-pay | Admitting: Family Medicine

## 2023-12-11 VITALS — BP 118/78 | HR 76 | Temp 98.1°F | Wt 146.0 lb

## 2023-12-11 DIAGNOSIS — Z202 Contact with and (suspected) exposure to infections with a predominantly sexual mode of transmission: Secondary | ICD-10-CM | POA: Insufficient documentation

## 2023-12-11 DIAGNOSIS — Z113 Encounter for screening for infections with a predominantly sexual mode of transmission: Secondary | ICD-10-CM

## 2023-12-11 NOTE — Patient Instructions (Signed)

## 2023-12-11 NOTE — Progress Notes (Signed)
 Kristine [name] , 17-Apr-1981, 43 y.o., female MRN: [id number] Patient Care Team    Relationship Specialty Notifications Start End  Kristine Shope, DO PCP - General Family Medicine  04/28/15    Comment: Patient request female provider    Chief Complaint  Patient presents with   Exposure to STD     Subjective: Kristine [name] is a 43 y.o. Pt presents for an OV with concerns of exposure to STD, requesting screenings.  Pt reports she has a mild vaginal discharge only. Her significant other has been unfaithful, which has her concerned for STD cause.      10/31/2023    7:46 AM 09/07/2023    7:43 AM 06/01/2023   10:38 AM 04/19/2023    9:11 AM 04/03/2023    8:12 AM  Depression screen PHQ 2/9  Decreased Interest 1 1 1 1 1   Down, Depressed, Hopeless 1 1 1 2 2   PHQ - 2 Score 2 2 2 3 3   Altered sleeping 3 2 0 1 2  Tired, decreased energy 2 1 1 1 2   Change in appetite 0 0 0 2 1  Feeling bad or failure about yourself  1 1 1 3 2   Trouble concentrating 2 0 0 2 2  Moving slowly or fidgety/restless 0 0 0 0 2  Suicidal thoughts 0 0 0 0 0  PHQ-9 Score 10 6 4 12 14   Difficult doing work/chores Somewhat difficult Somewhat difficult Somewhat difficult Somewhat difficult Somewhat difficult    Allergies  Allergen Reactions   Shellfish Allergy Anaphylaxis   Social History   Social History Narrative   [name] lives with her son in [place]. 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   Past Medical History:  Diagnosis Date   Anxiety    Chicken pox    Depression    Frequent headaches    Hyperhidrosis    Hyperlipidemia    Hypertension    Past Surgical History:  Procedure Laterality Date   CESAREAN SECTION  07/18/1999   CYST EXCISION     MASS EXCISION Right 09/19/2022   Procedure: EXCISION OF RIGHT SHOULDER MASS;  Surgeon: Enid Harry, MD;  Location: [facility];  Service: General;  Laterality: Right;  LMA   Family History  Problem Relation Age of  Onset   Hypertension Mother    Celiac disease Son    Breast cancer Neg Hx    Allergies as of 12/11/2023       Reactions   Shellfish Allergy Anaphylaxis        Medication List        Accurate as of Dec 11, 2023  2:15 PM. If you have any questions, ask your nurse or doctor.          amLODipine  10 MG tablet Commonly known as: NORVASC  Take 1 tablet (10 mg total) by mouth daily.   atorvastatin  20 MG tablet Commonly known as: LIPITOR Take 1 tablet (20 mg total) by mouth daily.   clonazePAM  0.5 MG tablet Commonly known as: KLONOPIN  Take 1 tablet (0.5 mg total) by mouth 2 (two) times daily.   escitalopram  20 MG tablet Commonly known as: LEXAPRO  Take 1 tablet (20 mg total) by mouth daily.   ondansetron  4 MG disintegrating tablet Commonly known as: ZOFRAN -ODT Take 1 tablet (4 mg total) by mouth every 8 (eight) hours as needed for nausea or vomiting.   Wegovy  2.4 MG/0.75ML Soaj Generic drug: Semaglutide -Weight Management  Inject 2.4 mg into the skin once a week.        All past medical history, surgical history, allergies, family history, immunizations andmedications were updated in the EMR today and reviewed under the history and medication portions of their EMR.     ROS Negative, with the exception of above mentioned in HPI   Objective:  BP 118/78   Pulse 76   Temp 98.1 F (36.7 C)   Wt 146 lb (66.2 kg)   LMP 11/19/2023   SpO2 99%   BMI 25.86 kg/m  Body mass index is 25.86 kg/m. Physical Exam Vitals and nursing note reviewed.  Constitutional:      General: She is not in acute distress.    Appearance: Normal appearance. She is normal weight. She is not ill-appearing or toxic-appearing.  HENT:     Head: Normocephalic and atraumatic.  Eyes:     General: No scleral icterus.       Right eye: No discharge.        Left eye: No discharge.     Extraocular Movements: Extraocular movements intact.     Conjunctiva/sclera: Conjunctivae normal.     Pupils:  Pupils are equal, round, and reactive to light.  Skin:    Findings: No rash.  Neurological:     Mental Status: She is alert and oriented to person, place, and time. Mental status is at baseline.     Motor: No weakness.     Coordination: Coordination normal.     Gait: Gait normal.  Psychiatric:        Mood and Affect: Mood normal.        Behavior: Behavior normal.        Thought Content: Thought content normal.        Judgment: Judgment normal.      No results found. No results found. No results found for this or any previous visit (from the past 24 hours).  Assessment/Plan: Kristine [name] is a 43 y.o. female present for OV for  Screen for STD (sexually transmitted disease) (Primary)/ Exposure to STD Pt will be called with results and further plan discussed.  - Cervicovaginal ancillary only( [place])-self vaginal swab - RPR - HIV antibody (with reflex) - Hepatitis C Antibody - HSV(herpes simplex vrs) 1+2 ab-IgG    Reviewed expectations re: course of current medical issues. Discussed self-management of symptoms. Outlined signs and symptoms indicating need for more acute intervention. Patient verbalized understanding and all questions were answered. Patient received an After-Visit Summary.    Orders Placed This Encounter  Procedures   RPR   HIV antibody (with reflex)   Hepatitis C Antibody   HSV(herpes simplex vrs) 1+2 ab-IgG   No orders of the defined types were placed in this encounter.  Referral Orders  No referral(s) requested today     Note is dictated utilizing voice recognition software. Although note has been proof read prior to signing, occasional typographical errors still can be missed. If any questions arise, please do not hesitate to call for verification.   electronically signed by:  Napolean Backbone, DO   [facility] - [place]

## 2023-12-12 ENCOUNTER — Ambulatory Visit: Payer: Self-pay | Admitting: Family Medicine

## 2023-12-12 LAB — CERVICOVAGINAL ANCILLARY ONLY
Bacterial Vaginitis (gardnerella): POSITIVE — AB
Candida Glabrata: NEGATIVE
Candida Vaginitis: NEGATIVE
Chlamydia: NEGATIVE
Comment: NEGATIVE
Comment: NEGATIVE
Comment: NEGATIVE
Comment: NEGATIVE
Comment: NEGATIVE
Comment: NORMAL
Neisseria Gonorrhea: NEGATIVE
Trichomonas: NEGATIVE

## 2023-12-12 LAB — RPR: RPR Ser Ql: NONREACTIVE

## 2023-12-12 LAB — HEPATITIS C ANTIBODY: Hepatitis C Ab: NONREACTIVE

## 2023-12-12 LAB — HIV ANTIBODY (ROUTINE TESTING W REFLEX): HIV 1&2 Ab, 4th Generation: NONREACTIVE

## 2023-12-12 LAB — HSV(HERPES SIMPLEX VRS) I + II AB-IGG
HSV 1 IGG,TYPE SPECIFIC AB: <0.9 {index}
HSV 2 IGG,TYPE SPECIFIC AB: 10.8 {index} — ABNORMAL HIGH

## 2023-12-13 ENCOUNTER — Encounter: Payer: Self-pay | Admitting: Family Medicine

## 2023-12-13 MED ORDER — METRONIDAZOLE 500 MG PO TABS: 500.0000 mg | ORAL_TABLET | Freq: Two times a day (BID) | ORAL | 0 refills | Status: AC

## 2023-12-26 ENCOUNTER — Ambulatory Visit: Admitting: Family Medicine

## 2024-01-15 DIAGNOSIS — M25562 Pain in left knee: Secondary | ICD-10-CM | POA: Diagnosis not present

## 2024-01-16 DIAGNOSIS — M7918 Myalgia, other site: Secondary | ICD-10-CM | POA: Diagnosis not present

## 2024-01-16 DIAGNOSIS — M940 Chondrocostal junction syndrome [Tietze]: Secondary | ICD-10-CM | POA: Diagnosis not present

## 2024-01-16 DIAGNOSIS — R0781 Pleurodynia: Secondary | ICD-10-CM | POA: Diagnosis not present

## 2024-02-06 ENCOUNTER — Encounter: Payer: Self-pay | Admitting: Family Medicine

## 2024-02-06 ENCOUNTER — Ambulatory Visit: Admitting: Family Medicine

## 2024-02-06 VITALS — BP 102/52 | HR 66 | Temp 97.8°F | Wt 142.6 lb

## 2024-02-06 DIAGNOSIS — I10 Essential (primary) hypertension: Secondary | ICD-10-CM | POA: Diagnosis not present

## 2024-02-06 DIAGNOSIS — E669 Obesity, unspecified: Secondary | ICD-10-CM

## 2024-02-06 DIAGNOSIS — E66811 Obesity, class 1: Secondary | ICD-10-CM

## 2024-02-06 DIAGNOSIS — Z6831 Body mass index (BMI) 31.0-31.9, adult: Secondary | ICD-10-CM

## 2024-02-06 DIAGNOSIS — F410 Panic disorder [episodic paroxysmal anxiety] without agoraphobia: Secondary | ICD-10-CM

## 2024-02-06 DIAGNOSIS — F418 Other specified anxiety disorders: Secondary | ICD-10-CM

## 2024-02-06 DIAGNOSIS — E661 Drug-induced obesity: Secondary | ICD-10-CM

## 2024-02-06 DIAGNOSIS — E785 Hyperlipidemia, unspecified: Secondary | ICD-10-CM | POA: Diagnosis not present

## 2024-02-06 MED ORDER — ATORVASTATIN CALCIUM 20 MG PO TABS: 20.0000 mg | ORAL_TABLET | Freq: Every day | ORAL | 3 refills | Status: AC

## 2024-02-06 MED ORDER — CLONAZEPAM 0.5 MG PO TABS: 0.5000 mg | ORAL_TABLET | Freq: Two times a day (BID) | ORAL | 5 refills | Status: DC

## 2024-02-06 MED ORDER — AMLODIPINE BESYLATE 10 MG PO TABS: 10.0000 mg | ORAL_TABLET | Freq: Every day | ORAL | 1 refills | Status: DC

## 2024-02-06 MED ORDER — WEGOVY 2.4 MG/0.75ML ~~LOC~~ SOAJ: 2.4000 mg | SUBCUTANEOUS | 5 refills | Status: DC

## 2024-02-06 MED ORDER — ESCITALOPRAM OXALATE 20 MG PO TABS: 20.0000 mg | ORAL_TABLET | Freq: Every day | ORAL | 1 refills | Status: DC

## 2024-02-06 NOTE — Progress Notes (Signed)
 Kristine [name] , May 12, 1981, 43 y.o., female MRN: [id number] Patient Care Team    Relationship Specialty Notifications Start End  Catherine Charlies LABOR, DO PCP - General Family Medicine  04/28/15    Comment: Patient request female provider    Chief Complaint  Patient presents with   Medical Management of Chronic Issues     Subjective: Kristine [name] is a 43 y.o. Pt presents for an OV  to discuss nutrition, obesity and weight loss.  Patient diagnosed with morbid obesity-comorbid conditions of hypertension and hyperlipidemia.  Wt/lbs: 182.5 > 172> 165> 155> 146 >142 BMI: 31.34>29.56> 29.26> 27.6> 25.9 >25.26 Patient reports she is tolerating the Wegovy  2.4 mg weekly.S mild nausea reported on occasion.  Diet: reviewed food diary initial encounter and made suggestion.   She has made the dietary changes recommended.   She has increased her water consumption in her diet.   Calories: Currently does not count calories, but doing well.  Exercise: She is working on increasing exercise. She has a puppy now, they are doing fast walking and she is using treadmill.   Depression with anxiety/sleep disturbance:  Reports compliance with lexapro  20 mg qd and Klonopin  0.5 mg twice daily.  Patient reports she feels her regimen is working well for her.   Hypertension/overweight/HLD:  Pt reports compliance with amlodipine  10 mg.  Patient denies chest pain, shortness of breath, dizziness or lower extremity edema.   RF: HTN, HLD, obesity ROS: See pertinent positives and negatives per HPI.    02/06/2024    1:56 PM 10/31/2023    7:46 AM 09/07/2023    7:43 AM 06/01/2023   10:38 AM 04/19/2023    9:11 AM  Depression screen PHQ 2/9  Decreased Interest 1 1 1 1 1   Down, Depressed, Hopeless 2 1 1 1 2   PHQ - 2 Score 3 2 2 2 3   Altered sleeping 2 3 2  0 1  Tired, decreased energy 1 2 1 1 1   Change in appetite 1 0 0 0 2  Feeling bad or failure about yourself  2 1 1 1 3   Trouble concentrating 1 2 0 0 2   Moving slowly or fidgety/restless 0 0 0 0 0  Suicidal thoughts 0 0 0 0 0  PHQ-9 Score 10 10 6 4 12   Difficult doing work/chores Somewhat difficult Somewhat difficult Somewhat difficult Somewhat difficult Somewhat difficult    Allergies  Allergen Reactions   Shellfish Allergy Anaphylaxis   Social History   Social History Narrative   Kristine [name] lives with her son in [place]. 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   Past Medical History:  Diagnosis Date   Anxiety    Chicken pox    Depression    Frequent headaches    HSV-2 seropositive    Hyperhidrosis    Hyperlipidemia    Hypertension    Past Surgical History:  Procedure Laterality Date   CESAREAN SECTION  07/18/1999   CYST EXCISION     MASS EXCISION Right 09/19/2022   Procedure: EXCISION OF RIGHT SHOULDER MASS;  Surgeon: Ebbie Cough, MD;  Location: [facility];  Service: General;  Laterality: Right;  LMA   Family History  Problem Relation Age of Onset   Hypertension Mother    Celiac disease Son    Breast cancer Neg Hx    Allergies as of 02/06/2024       Reactions   Shellfish Allergy Anaphylaxis  Medication List        Accurate as of February 06, 2024  2:10 PM. If you have any questions, ask your nurse or doctor.          amLODipine  10 MG tablet Commonly known as: NORVASC  Take 1 tablet (10 mg total) by mouth daily.   atorvastatin  20 MG tablet Commonly known as: LIPITOR Take 1 tablet (20 mg total) by mouth daily.   clonazePAM  0.5 MG tablet Commonly known as: KLONOPIN  Take 1 tablet (0.5 mg total) by mouth 2 (two) times daily.   escitalopram  20 MG tablet Commonly known as: LEXAPRO  Take 1 tablet (20 mg total) by mouth daily.   methocarbamol 500 MG tablet Commonly known as: ROBAXIN Take 500 mg by mouth 4 (four) times daily.   naproxen  500 MG tablet Commonly known as: NAPROSYN  Take 500 mg by mouth.   ondansetron  4 MG disintegrating tablet Commonly known as:  ZOFRAN -ODT Take 1 tablet (4 mg total) by mouth every 8 (eight) hours as needed for nausea or vomiting.   Wegovy  2.4 MG/0.75ML Soaj Generic drug: Semaglutide -Weight Management Inject 2.4 mg into the skin once a week.        All past medical history, surgical history, allergies, family history, immunizations andmedications were updated in the EMR today and reviewed under the history and medication portions of their EMR.     ROS Negative, with the exception of above mentioned in HPI   Objective:  BP (!) 102/52   Pulse 66   Temp 97.8 F (36.6 C)   Wt 142 lb 9.6 oz (64.7 kg)   LMP 02/02/2024   SpO2 98%   BMI 25.26 kg/m  Body mass index is 25.26 kg/m. Physical Exam Vitals and nursing note reviewed.  Constitutional:      General: She is not in acute distress.    Appearance: Normal appearance. She is normal weight. She is not ill-appearing, toxic-appearing or diaphoretic.  HENT:     Head: Normocephalic and atraumatic.  Eyes:     General: No scleral icterus.       Right eye: No discharge.        Left eye: No discharge.     Extraocular Movements: Extraocular movements intact.     Conjunctiva/sclera: Conjunctivae normal.     Pupils: Pupils are equal, round, and reactive to light.  Cardiovascular:     Rate and Rhythm: Normal rate and regular rhythm.  Pulmonary:     Effort: Pulmonary effort is normal. No respiratory distress.     Breath sounds: Normal breath sounds. No wheezing, rhonchi or rales.  Musculoskeletal:     Right lower leg: No edema.     Left lower leg: No edema.  Skin:    General: Skin is warm.     Findings: No rash.  Neurological:     Mental Status: She is alert and oriented to person, place, and time. Mental status is at baseline.     Motor: No weakness.     Coordination: Coordination normal.     Gait: Gait normal.  Psychiatric:        Mood and Affect: Mood normal.        Behavior: Behavior normal.        Thought Content: Thought content normal.         Judgment: Judgment normal.     No results found. No results found. No results found for this or any previous visit (from the past 24 hours).  Assessment/Plan: Kristine [name] is  a 43 y.o. 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  Applications for calorie counting.  Patient was advised to ensure she is taking in adequate nutrition daily by meeting calorie goals. -Patient was educated on dietary changes to not only lose weight but to eat healthy.   -Patient was educated on low glycemic index foods and lean proteins. -Patient was educated on exercise goal of 150 minutes a week of cardiovascular exercise with goal to obtain optimal heart rate to reach cardiovascular/fat burning zones. -Patient was encouraged to maintain adequate water consumption of at least 80-100 ounces a day. Continue Zofran  prescribed in the event of any nausea Continue  Wegovy  2.4 mg qd Follow-up in 6-8 weeks on weight loss counseling  Anxiety with depression/Panic attacks/sleep disturbance Stable Continue Lexapro  20 mg daily Continue Klonopin  0.5 mg twice daily (usually takes 1/2 tab in the day) Tried meds: BuSpar - did not work well.  Paxil -weight gain.  Pristiq -did not seem to work well. Wellbutrin -did not work well.  Xanax -rarely needs.  Seroquel-made too sleepy even on 12.5 mg, Valium  worked okay, but he had higher doses which caused sedation   Essential hypertension/Hyperlipidemia LDL goal <130/on statin/obesity Stable Continue amlodipine  10 mg QD. May be able to decrease if continues weight loss.  Continue Lipitor.   - continue exercise and low salt diet - f/u 5.5 mos on chronic conditions   Reviewed  expectations re: course of current medical issues. Discussed self-management of symptoms. Outlined signs and symptoms indicating need for more acute intervention. Patient verbalized understanding and all questions were answered. Patient received an After-Visit Summary.    No orders of the defined types were placed in this encounter.  Meds ordered this encounter  Medications   atorvastatin  (LIPITOR) 20 MG tablet    Sig: Take 1 tablet (20 mg total) by mouth daily.    Dispense:  90 tablet    Refill:  3   clonazePAM  (KLONOPIN ) 0.5 MG tablet    Sig: Take 1 tablet (0.5 mg total) by mouth 2 (two) times daily.    Dispense:  60 tablet    Refill:  5   WEGOVY  2.4 MG/0.75ML SOAJ    Sig: Inject 2.4 mg into the skin once a week.    Dispense:  3 mL    Refill:  5   amLODipine  (NORVASC ) 10 MG tablet    Sig: Take 1 tablet (10 mg total) by mouth daily.    Dispense:  90 tablet    Refill:  1   escitalopram  (LEXAPRO ) 20 MG tablet    Sig: Take 1 tablet (20 mg total) by mouth daily.    Dispense:  90 tablet    Refill:  1   Referral Orders  No referral(s) requested today     Note is dictated utilizing voice recognition software. Although note has been proof read prior to signing, occasional typographical errors still can be missed. If any questions arise, please do not hesitate to call for verification.   electronically signed by:  Charlies Bellini, DO  [facility] [place]

## 2024-02-06 NOTE — Patient Instructions (Addendum)

## 2024-02-11 DIAGNOSIS — G8918 Other acute postprocedural pain: Secondary | ICD-10-CM | POA: Diagnosis not present

## 2024-02-11 DIAGNOSIS — M794 Hypertrophy of (infrapatellar) fat pad: Secondary | ICD-10-CM | POA: Diagnosis not present

## 2024-02-11 DIAGNOSIS — M94262 Chondromalacia, left knee: Secondary | ICD-10-CM | POA: Diagnosis not present

## 2024-02-11 DIAGNOSIS — M6752 Plica syndrome, left knee: Secondary | ICD-10-CM | POA: Diagnosis not present

## 2024-02-11 DIAGNOSIS — M65962 Unspecified synovitis and tenosynovitis, left lower leg: Secondary | ICD-10-CM | POA: Diagnosis not present

## 2024-02-12 ENCOUNTER — Ambulatory Visit: Payer: Medicaid Other | Admitting: Family Medicine

## 2024-02-18 DIAGNOSIS — M25562 Pain in left knee: Secondary | ICD-10-CM | POA: Diagnosis not present

## 2024-02-26 DIAGNOSIS — M25562 Pain in left knee: Secondary | ICD-10-CM | POA: Diagnosis not present

## 2024-03-04 DIAGNOSIS — M25562 Pain in left knee: Secondary | ICD-10-CM | POA: Diagnosis not present

## 2024-03-11 DIAGNOSIS — M25562 Pain in left knee: Secondary | ICD-10-CM | POA: Diagnosis not present

## 2024-03-18 DIAGNOSIS — M25562 Pain in left knee: Secondary | ICD-10-CM | POA: Diagnosis not present

## 2024-03-25 DIAGNOSIS — M25562 Pain in left knee: Secondary | ICD-10-CM | POA: Diagnosis not present

## 2024-04-01 ENCOUNTER — Ambulatory Visit: Admitting: Family Medicine

## 2024-04-01 ENCOUNTER — Encounter: Payer: Self-pay | Admitting: Family Medicine

## 2024-04-01 VITALS — BP 116/70 | HR 64 | Temp 98.2°F | Wt 140.0 lb

## 2024-04-01 DIAGNOSIS — F410 Panic disorder [episodic paroxysmal anxiety] without agoraphobia: Secondary | ICD-10-CM | POA: Diagnosis not present

## 2024-04-01 DIAGNOSIS — E669 Obesity, unspecified: Secondary | ICD-10-CM | POA: Diagnosis not present

## 2024-04-01 DIAGNOSIS — Z1231 Encounter for screening mammogram for malignant neoplasm of breast: Secondary | ICD-10-CM

## 2024-04-01 DIAGNOSIS — G479 Sleep disorder, unspecified: Secondary | ICD-10-CM

## 2024-04-01 DIAGNOSIS — E66811 Obesity, class 1: Secondary | ICD-10-CM | POA: Diagnosis not present

## 2024-04-01 DIAGNOSIS — F418 Other specified anxiety disorders: Secondary | ICD-10-CM

## 2024-04-01 DIAGNOSIS — Z23 Encounter for immunization: Secondary | ICD-10-CM | POA: Diagnosis not present

## 2024-04-01 DIAGNOSIS — E785 Hyperlipidemia, unspecified: Secondary | ICD-10-CM

## 2024-04-01 DIAGNOSIS — Z6831 Body mass index (BMI) 31.0-31.9, adult: Secondary | ICD-10-CM | POA: Diagnosis not present

## 2024-04-01 DIAGNOSIS — E661 Drug-induced obesity: Secondary | ICD-10-CM | POA: Diagnosis not present

## 2024-04-01 DIAGNOSIS — I10 Essential (primary) hypertension: Secondary | ICD-10-CM

## 2024-04-01 NOTE — Progress Notes (Signed)
 Kristine [name] , February 13, 1981, 43 y.o., female MRN: [id number] Patient Care Team    Relationship Specialty Notifications Start End  Catherine Charlies LABOR, DO PCP - General Family Medicine  04/28/15    Comment: Patient request female provider    Chief Complaint  Patient presents with   Hypertension    Chronic Conditions/illness Management Offer influenza and hepatitis B vaccines      Subjective: Kristine [name] is a 43 y.o. Pt presents for an OV  to discuss nutrition, obesity and weight loss.  Patient diagnosed with morbid obesity-comorbid conditions of hypertension and hyperlipidemia.  Wt/lbs: 182.5 > 172> 165> 155> 146 >142>140 BMI: 31.34>29.56> 29.26> 27.6> 25.9 >25.26>24.8 Patient reports she is tolerating the Wegovy  2.4 mg weekly.S mild nausea reported on occasion.  Diet: reviewed food diary initial encounter and made suggestion.   She has made the dietary changes recommended.   She has increased her water consumption in her diet.   Calories: Currently does not count calories, but doing well.  Exercise: She is working on increasing exercise. She has a puppy now, they are doing fast walking and she is using treadmill.   Depression with anxiety/sleep disturbance:  Reports compliance with lexapro  20 mg qd and Klonopin  0.5 mg twice daily.  Patient reports she feels her regimen is working well for her.   Hypertension/overweight/HLD:  Pt reports compliance with amlodipine  10 mg.  Patient denies chest pain, shortness of breath, dizziness or lower extremity edema.   RF: HTN, HLD, obesity ROS: See pertinent positives and negatives per HPI.    02/06/2024    1:56 PM 10/31/2023    7:46 AM 09/07/2023    7:43 AM 06/01/2023   10:38 AM 04/19/2023    9:11 AM  Depression screen PHQ 2/9  Decreased Interest 1 1 1 1 1   Down, Depressed, Hopeless 2 1 1 1 2   PHQ - 2 Score 3 2 2 2 3   Altered sleeping 2 3 2  0 1  Tired, decreased energy 1 2 1 1 1   Change in appetite 1 0 0 0 2  Feeling bad  or failure about yourself  2 1 1 1 3   Trouble concentrating 1 2 0 0 2  Moving slowly or fidgety/restless 0 0 0 0 0  Suicidal thoughts 0 0 0 0 0  PHQ-9 Score 10 10 6 4 12   Difficult doing work/chores Somewhat difficult Somewhat difficult Somewhat difficult Somewhat difficult Somewhat difficult    Allergies  Allergen Reactions   Shellfish Allergy Anaphylaxis   Social History   Social History Narrative   Kristine [name] lives with her son in [place]. 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   Past Medical History:  Diagnosis Date   Anxiety    Chicken pox    Depression    Frequent headaches    HSV-2 seropositive    Hyperhidrosis    Hyperlipidemia    Hypertension    Past Surgical History:  Procedure Laterality Date   CESAREAN SECTION  07/18/1999   CYST EXCISION     MASS EXCISION Right 09/19/2022   Procedure: EXCISION OF RIGHT SHOULDER MASS;  Surgeon: Ebbie Cough, MD;  Location: [facility];  Service: General;  Laterality: Right;  LMA   Family History  Problem Relation Age of Onset   Hypertension Mother    Celiac disease Son    Breast cancer Neg Hx    Allergies as of 04/01/2024  Reactions   Shellfish Allergy Anaphylaxis        Medication List        Accurate as of April 01, 2024  7:59 AM. If you have any questions, ask your nurse or doctor.          STOP taking these medications    methocarbamol 500 MG tablet Commonly known as: ROBAXIN Stopped by: Charlies Bellini       TAKE these medications    amLODipine  10 MG tablet Commonly known as: NORVASC  Take 1 tablet (10 mg total) by mouth daily.   atorvastatin  20 MG tablet Commonly known as: LIPITOR Take 1 tablet (20 mg total) by mouth daily.   clonazePAM  0.5 MG tablet Commonly known as: KLONOPIN  Take 1 tablet (0.5 mg total) by mouth 2 (two) times daily.   escitalopram  20 MG tablet Commonly known as: LEXAPRO  Take 1 tablet (20 mg total) by mouth daily.   ondansetron  4  MG disintegrating tablet Commonly known as: ZOFRAN -ODT Take 1 tablet (4 mg total) by mouth every 8 (eight) hours as needed for nausea or vomiting.   Wegovy  2.4 MG/0.75ML Soaj SQ injection Generic drug: semaglutide -weight management Inject 2.4 mg into the skin once a week.        All past medical history, surgical history, allergies, family history, immunizations andmedications were updated in the EMR today and reviewed under the history and medication portions of their EMR.     ROS Negative, with the exception of above mentioned in HPI   Objective:  BP 116/70   Pulse 64   Temp 98.2 F (36.8 C)   Wt 140 lb (63.5 kg)   SpO2 98%   BMI 24.80 kg/m  Body mass index is 24.8 kg/m. Physical Exam Vitals and nursing note reviewed.  Constitutional:      General: She is not in acute distress.    Appearance: Normal appearance. She is normal weight. She is not ill-appearing, toxic-appearing or diaphoretic.  HENT:     Head: Normocephalic and atraumatic.  Eyes:     General: No scleral icterus.       Right eye: No discharge.        Left eye: No discharge.     Extraocular Movements: Extraocular movements intact.     Conjunctiva/sclera: Conjunctivae normal.     Pupils: Pupils are equal, round, and reactive to light.  Cardiovascular:     Rate and Rhythm: Normal rate and regular rhythm.  Pulmonary:     Effort: Pulmonary effort is normal. No respiratory distress.     Breath sounds: Normal breath sounds. No wheezing, rhonchi or rales.  Musculoskeletal:     Right lower leg: No edema.     Left lower leg: No edema.  Skin:    General: Skin is warm.     Findings: No rash.  Neurological:     Mental Status: She is alert and oriented to person, place, and time. Mental status is at baseline.     Motor: No weakness.     Coordination: Coordination normal.     Gait: Gait normal.  Psychiatric:        Mood and Affect: Mood normal.        Behavior: Behavior normal.        Thought Content:  Thought content normal.        Judgment: Judgment normal.     No results found. No results found. No results found for this or any previous visit (from the past 24 hours).  Assessment/Plan: Kristine [name] is a 43 y.o. 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  Applications for calorie counting.  Patient was advised to ensure she is taking in adequate nutrition daily by meeting calorie goals. -Patient was educated on dietary changes to not only lose weight but to eat healthy.   -Patient was educated on low glycemic index foods and lean proteins. -Patient was educated on exercise goal of 150 minutes a week of cardiovascular exercise with goal to obtain optimal heart rate to reach cardiovascular/fat burning zones. -Patient was encouraged to maintain adequate water consumption of at least 80-100 ounces a day. Continue Zofran  prescribed in the event of any nausea Continue Wegovy  2.4 mg qd Follow-up in 12 weeks on weight loss counseling  Anxiety with depression/Panic attacks/sleep disturbance Stable Continue Lexapro  20 mg daily Continue Klonopin  0.5 mg twice daily (usually takes 1/2 tab in the day) Tried meds: BuSpar - did not work well.  Paxil -weight gain.  Pristiq -did not seem to work well. Wellbutrin -did not work well.  Xanax -rarely needs.  Seroquel-made too sleepy even on 12.5 mg, Valium  worked okay, but he had higher doses which caused sedation   Essential hypertension/Hyperlipidemia LDL goal <130/on statin/obesity Stable Continue amlodipine  10 mg QD. May be able to decrease if continues weight loss.  Continue Lipitor.   - continue exercise and low salt diet  Reviewed expectations  re: course of current medical issues. Discussed self-management of symptoms. Outlined signs and symptoms indicating need for more acute intervention. Patient verbalized understanding and all questions were answered. Patient received an After-Visit Summary.    Orders Placed This Encounter  Procedures   MM 3D SCREENING MAMMOGRAM BILATERAL BREAST   No orders of the defined types were placed in this encounter.  Referral Orders  No referral(s) requested today     Note is dictated utilizing voice recognition software. Although note has been proof read prior to signing, occasional typographical errors still can be missed. If any questions arise, please do not hesitate to call for verification.   electronically signed by:  Charlies Bellini, DO  [facility] [place]

## 2024-04-01 NOTE — Patient Instructions (Addendum)
 Return in about 3 months (around 06/23/2024) for Routine chronic condition follow-up.        Great to see you today.  I have refilled the medication(s) we provide.   If labs were collected or images ordered, we will inform you of  results once we have received them and reviewed. We will contact you either by echart message, or telephone call.  Please give ample time to the testing facility, and our office to run,  receive and review results. Please do not call inquiring of results, even if you can see them in your chart. We will contact you as soon as we are able. If it has been over 1 week since the test was completed, and you have not yet heard from us , then please call us .    - echart message- for normal results that have been seen by the patient already.   - telephone call: abnormal results or if patient has not viewed results in their echart.  If a referral to a specialist was entered for you, please call us  in 2 weeks if you have not heard from the specialist office to schedule.

## 2024-04-15 DIAGNOSIS — M25562 Pain in left knee: Secondary | ICD-10-CM | POA: Diagnosis not present

## 2024-04-17 DIAGNOSIS — M25562 Pain in left knee: Secondary | ICD-10-CM | POA: Diagnosis not present

## 2024-04-22 DIAGNOSIS — M25562 Pain in left knee: Secondary | ICD-10-CM | POA: Diagnosis not present

## 2024-05-12 ENCOUNTER — Ambulatory Visit
Admission: RE | Admit: 2024-05-12 | Discharge: 2024-05-12 | Disposition: A | Payer: Medicaid Other | Source: Ambulatory Visit | Attending: Family Medicine | Admitting: Family Medicine

## 2024-05-12 DIAGNOSIS — Z1231 Encounter for screening mammogram for malignant neoplasm of breast: Secondary | ICD-10-CM | POA: Diagnosis not present

## 2024-05-14 ENCOUNTER — Other Ambulatory Visit (HOSPITAL_COMMUNITY): Payer: Self-pay

## 2024-05-15 NOTE — Telephone Encounter (Signed)
 A user error has taken place: orders placed in error, not carried out on this patient.

## 2024-05-16 ENCOUNTER — Ambulatory Visit: Payer: Self-pay | Admitting: Family Medicine

## 2024-07-01 ENCOUNTER — Encounter: Payer: Self-pay | Admitting: Family Medicine

## 2024-07-01 ENCOUNTER — Ambulatory Visit: Admitting: Family Medicine

## 2024-07-01 VITALS — BP 118/74 | HR 62 | Temp 98.2°F | Wt 147.0 lb

## 2024-07-01 DIAGNOSIS — F410 Panic disorder [episodic paroxysmal anxiety] without agoraphobia: Secondary | ICD-10-CM | POA: Diagnosis not present

## 2024-07-01 DIAGNOSIS — E559 Vitamin D deficiency, unspecified: Secondary | ICD-10-CM | POA: Diagnosis not present

## 2024-07-01 DIAGNOSIS — F418 Other specified anxiety disorders: Secondary | ICD-10-CM

## 2024-07-01 DIAGNOSIS — E785 Hyperlipidemia, unspecified: Secondary | ICD-10-CM | POA: Diagnosis not present

## 2024-07-01 DIAGNOSIS — G479 Sleep disorder, unspecified: Secondary | ICD-10-CM | POA: Diagnosis not present

## 2024-07-01 MED ORDER — CLONAZEPAM 1 MG PO TABS: 0.5000 mg | ORAL_TABLET | Freq: Two times a day (BID) | ORAL | 5 refills | Status: AC

## 2024-07-01 MED ORDER — ZEPBOUND 7.5 MG/0.5ML ~~LOC~~ SOAJ: 7.5000 mg | SUBCUTANEOUS | 5 refills | Status: DC

## 2024-07-01 MED ORDER — ESCITALOPRAM OXALATE 20 MG PO TABS: 20.0000 mg | ORAL_TABLET | Freq: Every day | ORAL | 1 refills | Status: AC

## 2024-07-01 MED ORDER — AMLODIPINE BESYLATE 10 MG PO TABS: 10.0000 mg | ORAL_TABLET | Freq: Every day | ORAL | 1 refills | Status: AC

## 2024-07-01 NOTE — Patient Instructions (Addendum)

## 2024-07-01 NOTE — Progress Notes (Signed)
 Kristine [name] , 1980-07-24, 43 y.o., female MRN: [id number] Patient Care Team    Relationship Specialty Notifications Start End  Catherine Charlies LABOR, DO PCP - General Family Medicine  04/28/15    Comment: Patient request female provider    Chief Complaint  Patient presents with   Weight Management Screening     Subjective: Kristine [name] is a 43 y.o. Pt presents for an OV  to discuss nutrition, obesity and weight loss.  Patient diagnosed with morbid obesity-comorbid conditions of hypertension and hyperlipidemia.  Wt/lbs: 182.5 > 172> 165> 155> 146 >142>140>147 BMI: 31.34>29.56> 29.26> 27.6> 25.9 >25.26>24.8>26.04 Patient reports she is tolerating the Wegovy  2.4 mg weekly.insurance coverage stopped. She has gained 7 lbs.  Diet: reviewed food diary initial encounter and made suggestion.  She has made the dietary changes recommended.   She has increased her water consumption in her diet.  Calories: Currently does not count calories, but doing well.  Exercise: She is working on increasing exercise. She has a puppy now, they are doing fast walking and she is using treadmill.   Depression with anxiety/sleep disturbance:  Reports compliance with lexapro  20 mg qd and Klonopin  0.5 mg twice daily.  Patient reports regimen is working well for her.  She feels like she is still not sleeping well.  Hypertension/overweight/HLD:  Pt reports compliance with amlodipine  10 mg.  Patient denies chest pain, shortness of breath, dizziness or lower extremity edema.   RF: HTN, HLD, obesity ROS: See pertinent positives and negatives per HPI.    07/01/2024    7:44 AM 02/06/2024    1:56 PM 10/31/2023    7:46 AM 09/07/2023    7:43 AM 06/01/2023   10:38 AM  Depression screen PHQ 2/9  Decreased Interest 1 1 1 1 1   Down, Depressed, Hopeless 1 2 1 1 1   PHQ - 2 Score 2 3 2 2 2   Altered sleeping 2 2 3 2  0  Tired, decreased energy 2 1 2 1 1   Change in appetite 1 1 0 0 0  Feeling bad or failure about  yourself  2 2 1 1 1   Trouble concentrating 3 1 2  0 0  Moving slowly or fidgety/restless 0 0 0 0 0  Suicidal thoughts 0 0 0 0 0  PHQ-9 Score 12 10  10  6  4    Difficult doing work/chores Somewhat difficult Somewhat difficult Somewhat difficult Somewhat difficult Somewhat difficult     Data saved with a previous flowsheet row definition    Allergies  Allergen Reactions   Shellfish Allergy Anaphylaxis   Social History   Social History Narrative   Kristine [name] lives with her son in [place]. 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   Past Medical History:  Diagnosis Date   Anxiety    Chicken pox    Depression    Frequent headaches    HSV-2 seropositive    Hyperhidrosis    Hyperlipidemia    Hypertension    Past Surgical History:  Procedure Laterality Date   CESAREAN SECTION  07/18/1999   CYST EXCISION     MASS EXCISION Right 09/19/2022   Procedure: EXCISION OF RIGHT SHOULDER MASS;  Surgeon: Ebbie Cough, MD;  Location: [facility];  Service: General;  Laterality: Right;  LMA   Family History  Problem Relation Age of Onset   Hypertension Mother    Celiac disease Son    Breast cancer Neg Hx  Allergies as of 07/01/2024       Reactions   Shellfish Allergy Anaphylaxis        Medication List        Accurate as of July 01, 2024  1:39 PM. If you have any questions, ask your nurse or doctor.          STOP taking these medications    Wegovy  2.4 MG/0.75ML Soaj SQ injection Generic drug: semaglutide -weight management Stopped by: Charlies Bellini, DO       TAKE these medications    amLODipine  10 MG tablet Commonly known as: NORVASC  Take 1 tablet (10 mg total) by mouth daily.   atorvastatin  20 MG tablet Commonly known as: LIPITOR Take 1 tablet (20 mg total) by mouth daily.   clonazePAM  1 MG tablet Commonly known as: KLONOPIN  Take 0.5-1 tablets (0.5-1 mg total) by mouth 2 (two) times daily. What changed:  medication  strength how much to take Changed by: Charlies Bellini, DO   escitalopram  20 MG tablet Commonly known as: LEXAPRO  Take 1 tablet (20 mg total) by mouth daily.   ondansetron  4 MG disintegrating tablet Commonly known as: ZOFRAN -ODT Take 1 tablet (4 mg total) by mouth every 8 (eight) hours as needed for nausea or vomiting.   Zepbound  7.5 MG/0.5ML Pen Generic drug: tirzepatide  Inject 7.5 mg into the skin once a week. Started by: Charlies Bellini, DO        All past medical history, surgical history, allergies, family history, immunizations andmedications were updated in the EMR today and reviewed under the history and medication portions of their EMR.     Review of Systems  Constitutional: Negative.   HENT: Negative.    Eyes: Negative.   Respiratory: Negative.    Cardiovascular: Negative.   Gastrointestinal: Negative.   Genitourinary: Negative.   Musculoskeletal: Negative.   Skin: Negative.   Neurological: Negative.   Endo/Heme/Allergies: Negative.   Psychiatric/Behavioral: Negative.    All other systems reviewed and are negative.  Negative, with the exception of above mentioned in HPI   Objective:  BP 118/74   Pulse 62   Temp 98.2 F (36.8 C)   Wt 147 lb (66.7 kg)   SpO2 98%   BMI 26.04 kg/m  Body mass index is 26.04 kg/m. Physical Exam Vitals and nursing note reviewed.  Constitutional:      General: She is not in acute distress.    Appearance: Normal appearance. She is normal weight. She is not ill-appearing, toxic-appearing or diaphoretic.  HENT:     Head: Normocephalic and atraumatic.  Eyes:     General: No scleral icterus.       Right eye: No discharge.        Left eye: No discharge.     Extraocular Movements: Extraocular movements intact.     Conjunctiva/sclera: Conjunctivae normal.     Pupils: Pupils are equal, round, and reactive to light.  Cardiovascular:     Rate and Rhythm: Normal rate and regular rhythm.     Heart sounds: No murmur heard. Pulmonary:      Effort: Pulmonary effort is normal. No respiratory distress.     Breath sounds: Normal breath sounds. No wheezing, rhonchi or rales.  Musculoskeletal:     Right lower leg: No edema.     Left lower leg: No edema.  Skin:    General: Skin is warm.     Findings: No rash.  Neurological:     Mental Status: She is alert and oriented to person, place,  and time. Mental status is at baseline.     Motor: No weakness.     Coordination: Coordination normal.     Gait: Gait normal.  Psychiatric:        Mood and Affect: Mood normal.        Behavior: Behavior normal.        Thought Content: Thought content normal.        Judgment: Judgment normal.     No results found. No results found. No results found for this or any previous visit (from the past 24 hours).  Assessment/Plan: Kristine [name] is a 43 y.o. female present for OV for Chronic Conditions/illness Management E66.3/HTN/HLD Patient was counseled on exercise, calorie counting, weight loss and potential medications to help with weight loss today -Patient was provided with online resources for: Weekly net calorie calculator.  Applications for calorie counting.  Patient was advised to ensure she is taking in adequate nutrition daily by meeting calorie goals. -Patient was educated on dietary changes to not only lose weight but to eat healthy.   -Patient was educated on low glycemic index foods and lean proteins. -Patient was educated on exercise goal of 150 minutes a week of cardiovascular exercise with goal to obtain optimal heart rate to reach cardiovascular/fat burning zones. -Patient was encouraged to maintain adequate water consumption of at least 80-100 ounces a day. Continue Zofran  prescribed in the event of any nausea Start Zepbound  7.5 mg injection every 7-10 days, 1 week after finishing her Wegovy  2.4 mg injection.  Sent to [facility] direct   Anxiety with depression/Panic attacks/sleep disturbance Stable Continue Lexapro  20 mg  daily Continue Klonopin  0.5 mg/1 mg twice daily (usually takes 1/2 tab in the day) Tried meds: BuSpar - did not work well.  Paxil -weight gain.  Pristiq -did not seem to work well. Wellbutrin -did not work well.  Xanax -rarely needs.  Seroquel-made too sleepy even on 12.5 mg, Valium  worked okay, but he had higher doses which caused sedation   Essential hypertension/Hyperlipidemia LDL goal <130/on statin/obesity Stable Continue amlodipine  10 mg QD.M - continue exercise and low salt diet  Return in about 11 weeks (around 09/16/2024).     Reviewed expectations re: course of current medical issues. Discussed self-management of symptoms. Outlined signs and symptoms indicating need for more acute intervention. Patient verbalized understanding and all questions were answered. Patient received an After-Visit Summary.    No orders of the defined types were placed in this encounter.  Meds ordered this encounter  Medications   amLODipine  (NORVASC ) 10 MG tablet    Sig: Take 1 tablet (10 mg total) by mouth daily.    Dispense:  90 tablet    Refill:  1   clonazePAM  (KLONOPIN ) 1 MG tablet    Sig: Take 0.5-1 tablets (0.5-1 mg total) by mouth 2 (two) times daily.    Dispense:  60 tablet    Refill:  5   escitalopram  (LEXAPRO ) 20 MG tablet    Sig: Take 1 tablet (20 mg total) by mouth daily.    Dispense:  90 tablet    Refill:  1   tirzepatide  (ZEPBOUND ) 7.5 MG/0.5ML Pen    Sig: Inject 7.5 mg into the skin once a week.    Dispense:  2 mL    Refill:  5   Referral Orders  No referral(s) requested today     Note is dictated utilizing voice recognition software. Although note has been proof read prior to signing, occasional typographical errors still can be missed. If any  questions arise, please do not hesitate to call for verification.   electronically signed by:  Charlies Bellini, DO  [facility] - [place]

## 2024-08-08 ENCOUNTER — Encounter: Payer: Self-pay | Admitting: Family Medicine

## 2024-08-08 NOTE — Telephone Encounter (Signed)
 No further action needed.

## 2024-08-19 ENCOUNTER — Encounter: Payer: Self-pay | Admitting: Family Medicine

## 2024-08-19 ENCOUNTER — Ambulatory Visit: Admitting: Family Medicine

## 2024-08-19 VITALS — BP 112/74 | HR 65 | Temp 98.1°F | Wt 162.8 lb

## 2024-08-19 DIAGNOSIS — E785 Hyperlipidemia, unspecified: Secondary | ICD-10-CM

## 2024-08-19 DIAGNOSIS — I10 Essential (primary) hypertension: Secondary | ICD-10-CM

## 2024-08-19 DIAGNOSIS — Z713 Dietary counseling and surveillance: Secondary | ICD-10-CM

## 2024-08-19 DIAGNOSIS — E663 Overweight: Secondary | ICD-10-CM

## 2024-08-19 NOTE — Patient Instructions (Signed)
 Return in about 5 weeks (around 09/23/2024) for Routine chronic condition follow-up.        Great to see you today.  I have refilled the medication(s) we provide.   If labs were collected or images ordered, we will inform you of  results once we have received them and reviewed. We will contact you either by echart message, or telephone call.  Please give ample time to the testing facility, and our office to run,  receive and review results. Please do not call inquiring of results, even if you can see them in your chart. We will contact you as soon as we are able. If it has been over 1 week since the test was completed, and you have not yet heard from us , then please call us .    - echart message- for normal results that have been seen by the patient already.   - telephone call: abnormal results or if patient has not viewed results in their echart.  If a referral to a specialist was entered for you, please call us  in 2 weeks if you have not heard from the specialist office to schedule.

## 2024-08-20 MED ORDER — WEGOVY 1 MG/0.5ML ~~LOC~~ SOAJ: 1.0000 mg | SUBCUTANEOUS | 0 refills | Status: AC

## 2024-09-16 ENCOUNTER — Ambulatory Visit: Admitting: Family Medicine

## 2025-05-15 ENCOUNTER — Encounter
